# Patient Record
Sex: Male | Born: 1950
Health system: Southern US, Community
[De-identification: ages and names within clinical notes are randomized; demographics above are authoritative.]

## PROBLEM LIST (undated history)

## (undated) DIAGNOSIS — M1990 Unspecified osteoarthritis, unspecified site: Secondary | ICD-10-CM

## (undated) DIAGNOSIS — I10 Essential (primary) hypertension: Secondary | ICD-10-CM

## (undated) DIAGNOSIS — Z8781 Personal history of (healed) traumatic fracture: Secondary | ICD-10-CM

## (undated) DIAGNOSIS — E785 Hyperlipidemia, unspecified: Secondary | ICD-10-CM

## (undated) DIAGNOSIS — G4700 Insomnia, unspecified: Secondary | ICD-10-CM

## (undated) DIAGNOSIS — R519 Headache, unspecified: Secondary | ICD-10-CM

## (undated) DIAGNOSIS — K219 Gastro-esophageal reflux disease without esophagitis: Secondary | ICD-10-CM

## (undated) DIAGNOSIS — M545 Low back pain, unspecified: Secondary | ICD-10-CM

## (undated) DIAGNOSIS — R51 Headache: Secondary | ICD-10-CM

## (undated) DIAGNOSIS — G8928 Other chronic postprocedural pain: Secondary | ICD-10-CM

## (undated) DIAGNOSIS — R748 Abnormal levels of other serum enzymes: Secondary | ICD-10-CM

## (undated) DIAGNOSIS — G8929 Other chronic pain: Secondary | ICD-10-CM

## (undated) HISTORY — DX: Personal history of (healed) traumatic fracture: Z87.81

## (undated) HISTORY — DX: Insomnia, unspecified: G47.00

## (undated) HISTORY — DX: Hyperlipidemia, unspecified: E78.5

## (undated) HISTORY — DX: Other chronic postprocedural pain: G89.28

## (undated) HISTORY — DX: Abnormal levels of other serum enzymes: R74.8

## (undated) HISTORY — DX: Low back pain: M54.5

## (undated) HISTORY — DX: Low back pain, unspecified: M54.50

## (undated) HISTORY — DX: Headache, unspecified: R51.9

## (undated) HISTORY — DX: Essential (primary) hypertension: I10

## (undated) HISTORY — DX: Other chronic pain: G89.29

## (undated) HISTORY — DX: Unspecified osteoarthritis, unspecified site: M19.90

## (undated) HISTORY — DX: Headache: R51

---

## 1977-10-19 DIAGNOSIS — Z8781 Personal history of (healed) traumatic fracture: Secondary | ICD-10-CM | POA: Insufficient documentation

## 1977-10-19 HISTORY — PX: CERVICAL FUSION: SHX112

## 1977-10-19 HISTORY — DX: Personal history of (healed) traumatic fracture: Z87.81

## 1995-10-20 HISTORY — PX: OTHER SURGICAL HISTORY: SHX169

## 2010-11-10 ENCOUNTER — Encounter: Payer: Self-pay | Admitting: Orthopedic Surgery

## 2011-10-20 HISTORY — PX: OTHER SURGICAL HISTORY: SHX169

## 2012-07-22 ENCOUNTER — Other Ambulatory Visit: Payer: Self-pay | Admitting: Orthopedic Surgery

## 2012-07-22 DIAGNOSIS — M25512 Pain in left shoulder: Secondary | ICD-10-CM

## 2012-07-26 ENCOUNTER — Ambulatory Visit
Admission: RE | Admit: 2012-07-26 | Discharge: 2012-07-26 | Disposition: A | Discharge status: Home / Self Care | Payer: PRIVATE HEALTH INSURANCE | Source: Ambulatory Visit | Attending: Orthopedic Surgery | Admitting: Orthopedic Surgery

## 2012-07-26 DIAGNOSIS — M25512 Pain in left shoulder: Secondary | ICD-10-CM

## 2012-09-27 ENCOUNTER — Other Ambulatory Visit: Payer: Self-pay | Admitting: Orthopedic Surgery

## 2012-09-27 DIAGNOSIS — M542 Cervicalgia: Secondary | ICD-10-CM

## 2012-10-01 ENCOUNTER — Ambulatory Visit
Admission: RE | Admit: 2012-10-01 | Discharge: 2012-10-01 | Disposition: A | Discharge status: Home / Self Care | Payer: PRIVATE HEALTH INSURANCE | Source: Ambulatory Visit | Attending: Orthopedic Surgery | Admitting: Orthopedic Surgery

## 2012-10-01 DIAGNOSIS — M542 Cervicalgia: Secondary | ICD-10-CM

## 2012-10-01 MED ORDER — GADOBENATE DIMEGLUMINE 529 MG/ML IV SOLN
15.0000 mL | Freq: Once | INTRAVENOUS | Status: AC | PRN
  Administered 2012-10-01: 15 mL via INTRAVENOUS

## 2013-01-24 ENCOUNTER — Other Ambulatory Visit: Payer: Self-pay | Admitting: Orthopedic Surgery

## 2013-01-24 DIAGNOSIS — M542 Cervicalgia: Secondary | ICD-10-CM

## 2013-02-01 ENCOUNTER — Ambulatory Visit
Admission: RE | Admit: 2013-02-01 | Discharge: 2013-02-01 | Disposition: A | Discharge status: Home / Self Care | Payer: PRIVATE HEALTH INSURANCE | Source: Ambulatory Visit | Attending: Orthopedic Surgery | Admitting: Orthopedic Surgery

## 2013-02-01 VITALS — BP 124/80 | HR 75

## 2013-02-01 DIAGNOSIS — M542 Cervicalgia: Secondary | ICD-10-CM

## 2013-02-01 MED ORDER — IOHEXOL 300 MG/ML  SOLN
1.0000 mL | Freq: Once | INTRAMUSCULAR | Status: AC | PRN
  Administered 2013-02-01: 1 mL via EPIDURAL

## 2013-02-01 MED ORDER — TRIAMCINOLONE ACETONIDE 40 MG/ML IJ SUSP (RADIOLOGY)
60.0000 mg | Freq: Once | INTRAMUSCULAR | Status: AC
  Administered 2013-02-01: 60 mg via EPIDURAL

## 2013-07-26 ENCOUNTER — Emergency Department (HOSPITAL_COMMUNITY): Payer: PRIVATE HEALTH INSURANCE

## 2013-07-26 ENCOUNTER — Emergency Department (HOSPITAL_COMMUNITY)
Admission: EM | Admit: 2013-07-26 | Discharge: 2013-07-26 | Disposition: A | Discharge status: Home / Self Care | Payer: PRIVATE HEALTH INSURANCE | Attending: Emergency Medicine | Admitting: Emergency Medicine

## 2013-07-26 DIAGNOSIS — M546 Pain in thoracic spine: Secondary | ICD-10-CM | POA: Insufficient documentation

## 2013-07-26 DIAGNOSIS — R110 Nausea: Secondary | ICD-10-CM | POA: Insufficient documentation

## 2013-07-26 DIAGNOSIS — I10 Essential (primary) hypertension: Secondary | ICD-10-CM | POA: Insufficient documentation

## 2013-07-26 DIAGNOSIS — R109 Unspecified abdominal pain: Secondary | ICD-10-CM

## 2013-07-26 LAB — CBC WITH DIFFERENTIAL/PLATELET
Basophils Absolute: 0 10*3/uL (ref 0.0–0.1)
Basophils Relative: 0 % (ref 0–1)
Eosinophils Absolute: 0.4 10*3/uL (ref 0.0–0.7)
Eosinophils Relative: 3 % (ref 0–5)
HCT: 42 % (ref 39.0–52.0)
Hemoglobin: 14.5 g/dL (ref 13.0–17.0)
Lymphocytes Relative: 16 % (ref 12–46)
Lymphs Abs: 2.3 10*3/uL (ref 0.7–4.0)
MCH: 32.2 pg (ref 26.0–34.0)
MCHC: 34.5 g/dL (ref 30.0–36.0)
MCV: 93.3 fL (ref 78.0–100.0)
Monocytes Absolute: 1.3 10*3/uL — ABNORMAL HIGH (ref 0.1–1.0)
Monocytes Relative: 9 % (ref 3–12)
Neutro Abs: 10.3 10*3/uL — ABNORMAL HIGH (ref 1.7–7.7)
Neutrophils Relative %: 72 % (ref 43–77)
Platelets: 173 10*3/uL (ref 150–400)
RBC: 4.5 MIL/uL (ref 4.22–5.81)
RDW: 13.1 % (ref 11.5–15.5)
WBC: 14.3 10*3/uL — ABNORMAL HIGH (ref 4.0–10.5)

## 2013-07-26 LAB — URINALYSIS, ROUTINE W REFLEX MICROSCOPIC
Bilirubin Urine: NEGATIVE
Glucose, UA: NEGATIVE mg/dL
Hgb urine dipstick: NEGATIVE
Ketones, ur: 15 mg/dL — AB
Leukocytes, UA: NEGATIVE
Nitrite: NEGATIVE
Protein, ur: NEGATIVE mg/dL
Specific Gravity, Urine: 1.028 (ref 1.005–1.030)
Urobilinogen, UA: 1 mg/dL (ref 0.0–1.0)
pH: 6 (ref 5.0–8.0)

## 2013-07-26 LAB — ABO/RH: ABO/RH(D): O POS

## 2013-07-26 LAB — POCT I-STAT, CHEM 8
BUN: 6 mg/dL (ref 6–23)
Calcium, Ion: 1.17 mmol/L (ref 1.12–1.23)
Chloride: 103 mEq/L (ref 96–112)
Creatinine, Ser: 0.9 mg/dL (ref 0.50–1.35)
Glucose, Bld: 137 mg/dL — ABNORMAL HIGH (ref 70–99)
HCT: 46 % (ref 39.0–52.0)
Hemoglobin: 15.6 g/dL (ref 13.0–17.0)
Potassium: 3.8 mEq/L (ref 3.5–5.1)
Sodium: 142 mEq/L (ref 135–145)
TCO2: 25 mmol/L (ref 0–100)

## 2013-07-26 LAB — HEPATIC FUNCTION PANEL
ALT: 105 U/L — ABNORMAL HIGH (ref 0–53)
AST: 183 U/L — ABNORMAL HIGH (ref 0–37)
Albumin: 4.5 g/dL (ref 3.5–5.2)
Alkaline Phosphatase: 75 U/L (ref 39–117)
Bilirubin, Direct: 0.4 mg/dL — ABNORMAL HIGH (ref 0.0–0.3)
Indirect Bilirubin: 0.4 mg/dL (ref 0.3–0.9)
Total Bilirubin: 0.8 mg/dL (ref 0.3–1.2)
Total Protein: 7.6 g/dL (ref 6.0–8.3)

## 2013-07-26 LAB — TYPE AND SCREEN
ABO/RH(D): O POS
Antibody Screen: NEGATIVE

## 2013-07-26 LAB — POCT I-STAT TROPONIN I: Troponin i, poc: 0 ng/mL (ref 0.00–0.08)

## 2013-07-26 LAB — LIPASE, BLOOD: Lipase: 59 U/L (ref 11–59)

## 2013-07-26 MED ORDER — IOHEXOL 300 MG/ML  SOLN: 25.0000 mL | Freq: Once | INTRAMUSCULAR | Status: DC | PRN

## 2013-07-26 MED ORDER — OXYCODONE-ACETAMINOPHEN 5-325 MG PO TABS: 2.0000 | ORAL_TABLET | ORAL | Status: DC | PRN

## 2013-07-26 MED ORDER — FENTANYL CITRATE 0.05 MG/ML IJ SOLN
50.0000 ug | Freq: Once | INTRAMUSCULAR | Status: AC
  Administered 2013-07-26: 50 ug via INTRAVENOUS

## 2013-07-26 MED ORDER — FENTANYL CITRATE 0.05 MG/ML IJ SOLN
INTRAMUSCULAR | Status: AC
  Filled 2013-07-26: qty 2

## 2013-07-26 MED ORDER — IOHEXOL 300 MG/ML  SOLN
80.0000 mL | Freq: Once | INTRAMUSCULAR | Status: AC | PRN
  Administered 2013-07-26: 80 mL via INTRAVENOUS

## 2013-07-26 NOTE — ED Notes (Signed)
Pt made aware of need of urine specimen; pt handed urinal to attempt to provide a sample; family at bedside

## 2013-07-26 NOTE — ED Provider Notes (Signed)
CSN: [id number]     Arrival date & time 07/26/13  0509 History   First MD Initiated Contact with Patient 07/26/13 [phone]     Chief Complaint  Patient presents with  . Abdominal Pain    Patient is a 62 y.o. male presenting with abdominal pain. The history is provided by the patient and the EMS personnel.  Abdominal Pain Pain location:  RUQ and LUQ Pain radiation: upper back. Pain severity:  Severe Onset quality:  Sudden Duration:  3 hours Timing:  Constant Progression:  Worsening Chronicity:  New Context: awakening from sleep   Relieved by:  Nothing Worsened by:  Palpation and movement Associated symptoms: nausea   Associated symptoms: no chest pain, no fever and no vomiting    Pt reports he woke up with severe abdominal pain He reports it first started in lower abdomen and now seems to move into upper abdomen He denies cp/sob He has never had this pain before He denies h/o abdominal surgeries    PMH - HTN Soc hx - lives at home  History  Substance Use Topics  . Smoking status: Not on file  . Smokeless tobacco: Not on file  . Alcohol Use: Not on file    Review of Systems  Constitutional: Negative for fever.  Cardiovascular: Negative for chest pain.  Gastrointestinal: Positive for nausea and abdominal pain. Negative for vomiting.  All other systems reviewed and are negative.    Allergies  Review of patient's allergies indicates no known allergies.  Home Medications  No current outpatient prescriptions on file. BP 134/78  Pulse 83  Resp 12  SpO2 99%  Physical Exam CONSTITUTIONAL: uncomfortable appearing HEAD: Normocephalic/atraumatic EYES: EOMI/PERRL ENMT: Mucous membranes moist NECK: supple no meningeal signs SPINE:entire spine nontender CV: S1/S2 noted, no murmurs/rubs/gallops noted LUNGS: Lungs are clear to auscultation bilaterally, no apparent distress ABDOMEN: soft, significant tenderness to RUQ/epigastric/LUQ region.  No rebound.  His lower abdomen  is soft to palpation GU:no cva tenderness, no hernia noted.  No scrotal tenderness noted, chaperone present NEURO: Pt is awake/alert, moves all extremitiesx4 EXTREMITIES: pulses normal, full ROM SKIN: pale PSYCH:anxious  ED Course  Procedures Labs Review Labs Reviewed  CBC WITH DIFFERENTIAL  HEPATIC FUNCTION PANEL  LIPASE, BLOOD  TYPE AND SCREEN   5:35 AM Pt seen on arrival.  He is ill appearing, but his vitals are stable, no hypotension His pain is now localized in upper quadrants Bedside Ultrasound attempted but limited images and pt did not tolerate exam Will follow closely and will need CT imaging of abd/pelvis 8:12 AM Pt reports all of his pain is resolved He is in no distress His abdomen is soft and no focal RUQ tenderness ?gallbladder sludge on CT.  It is possible he had episode of biliary colic but now has no signs of surgical abdomen Pt is comfortable going home and have his PCP re-exam and order possible abdominal US His only lab test is u/a.  At signout, plan is to f/u on urinalysis.  He can then be discharged home  MDM  No diagnosis found. Nursing notes including past medical history and social history reviewed and considered in documentation Labs/vital reviewed and considered       Date: 07/26/2013  Rate: 77  Rhythm: normal sinus rhythm  QRS Axis: normal  Intervals: normal  ST/T Wave abnormalities: nonspecific ST changes  Conduction Disutrbances:none  Narrative Interpretation:   Old EKG Reviewed: none available at time of interpretation    Joya Gaskins, MD 07/26/13  0815 

## 2013-07-26 NOTE — ED Notes (Signed)
Ct made aware of 0.9 creatinine level

## 2013-07-26 NOTE — ED Notes (Signed)
Pt wife given a cup of coffee (spoon, sugar and creamer on the side)

## 2013-07-26 NOTE — ED Notes (Signed)
Pt d/c'd from both IV's, monitor, continuous pulse oximetry and blood pressure cuff; pt being discharged to go home; wife at bedside

## 2013-07-26 NOTE — ED Notes (Signed)
Pt taken to CT.

## 2013-07-26 NOTE — ED Notes (Signed)
Per EMS, PT awoke with severe abd pain in Upper ABD radiating to back and left shoulder.

## 2016-05-20 ENCOUNTER — Ambulatory Visit (INDEPENDENT_AMBULATORY_CARE_PROVIDER_SITE_OTHER): Payer: PRIVATE HEALTH INSURANCE | Admitting: Family Medicine

## 2016-05-20 ENCOUNTER — Telehealth: Payer: Self-pay | Admitting: Family Medicine

## 2016-05-20 ENCOUNTER — Encounter: Payer: Self-pay | Admitting: Family Medicine

## 2016-05-20 VITALS — BP 133/87 | HR 95 | Temp 98.3°F | Resp 20 | Ht 69.0 in | Wt 205.2 lb

## 2016-05-20 DIAGNOSIS — Z7189 Other specified counseling: Secondary | ICD-10-CM | POA: Diagnosis not present

## 2016-05-20 DIAGNOSIS — M1990 Unspecified osteoarthritis, unspecified site: Secondary | ICD-10-CM | POA: Insufficient documentation

## 2016-05-20 DIAGNOSIS — I10 Essential (primary) hypertension: Secondary | ICD-10-CM | POA: Insufficient documentation

## 2016-05-20 DIAGNOSIS — G4700 Insomnia, unspecified: Secondary | ICD-10-CM | POA: Insufficient documentation

## 2016-05-20 DIAGNOSIS — Z8781 Personal history of (healed) traumatic fracture: Secondary | ICD-10-CM

## 2016-05-20 DIAGNOSIS — G8929 Other chronic pain: Secondary | ICD-10-CM | POA: Insufficient documentation

## 2016-05-20 DIAGNOSIS — E785 Hyperlipidemia, unspecified: Secondary | ICD-10-CM | POA: Insufficient documentation

## 2016-05-20 DIAGNOSIS — G44329 Chronic post-traumatic headache, not intractable: Secondary | ICD-10-CM

## 2016-05-20 DIAGNOSIS — R519 Headache, unspecified: Secondary | ICD-10-CM | POA: Insufficient documentation

## 2016-05-20 DIAGNOSIS — G8928 Other chronic postprocedural pain: Secondary | ICD-10-CM

## 2016-05-20 DIAGNOSIS — F1190 Opioid use, unspecified, uncomplicated: Secondary | ICD-10-CM | POA: Insufficient documentation

## 2016-05-20 DIAGNOSIS — R51 Headache: Secondary | ICD-10-CM

## 2016-05-20 DIAGNOSIS — M545 Low back pain, unspecified: Secondary | ICD-10-CM | POA: Insufficient documentation

## 2016-05-20 DIAGNOSIS — Z79899 Other long term (current) drug therapy: Secondary | ICD-10-CM | POA: Insufficient documentation

## 2016-05-20 DIAGNOSIS — E669 Obesity, unspecified: Secondary | ICD-10-CM

## 2016-05-20 DIAGNOSIS — Z7689 Persons encountering health services in other specified circumstances: Secondary | ICD-10-CM

## 2016-05-20 LAB — COMPREHENSIVE METABOLIC PANEL
ALT: 29 U/L (ref 0–53)
AST: 33 U/L (ref 0–37)
Albumin: 4.6 g/dL (ref 3.5–5.2)
Alkaline Phosphatase: 49 U/L (ref 39–117)
BUN: 16 mg/dL (ref 6–23)
CO2: 28 mEq/L (ref 19–32)
Calcium: 10 mg/dL (ref 8.4–10.5)
Chloride: 106 mEq/L (ref 96–112)
Creatinine, Ser: 0.95 mg/dL (ref 0.40–1.50)
GFR: 85.21 mL/min (ref >60.00)
Glucose, Bld: 112 mg/dL — ABNORMAL HIGH (ref 70–99)
Potassium: 5.3 mEq/L — ABNORMAL HIGH (ref 3.5–5.1)
Sodium: 141 mEq/L (ref 135–145)
Total Bilirubin: 0.9 mg/dL (ref 0.2–1.2)
Total Protein: 7.2 g/dL (ref 6.0–8.3)
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
## 2016-05-20 NOTE — Telephone Encounter (Addendum)
Please call pt: - His kidney appears normal.  - His potassium is mildly elevated. If he is taking a potassium supplement please have him stop, and avoid high potassium foods for now. Will retest when we see him again.  - I did receive his last few OV notes. Please have him schedule an appt end next week for medication management for his chronic pain.   Results for orders placed or performed in visit on 05/20/16 (from the past 24 hour(s))  Comp Met (CMET)     Status: Abnormal   Collection Time: 05/20/16 11:03 AM  Result Value Ref Range   Sodium 141 135 - 145 mEq/L   Potassium 5.3 (H) 3.5 - 5.1 mEq/L   Chloride 106 96 - 112 mEq/L   CO2 28 19 - 32 mEq/L   Glucose, Bld 112 (H) 70 - 99 mg/dL   BUN 16 6 - 23 mg/dL   Creatinine, Ser 0.95 0.40 - 1.50 mg/dL   Total Bilirubin 0.9 0.2 - 1.2 mg/dL   Alkaline Phosphatase 49 39 - 117 U/L   AST 33 0 - 37 U/L   ALT 29 0 - 53 U/L   Total Protein 7.2 6.0 - 8.3 g/dL   Albumin 4.6 3.5 - 5.2 g/dL   Calcium 10.0 8.4 - 10.5 mg/dL   GFR 85.21 >60.00 mL/min

## 2016-05-20 NOTE — Patient Instructions (Signed)
It was a pleasure meeting you .  I will get records faxed ASAP.  Likely be able to get you in next week to discuss medications for your neck.

## 2016-05-21 NOTE — Telephone Encounter (Signed)
Left message for patient to return call to review results. 

## 2016-05-22 NOTE — Telephone Encounter (Signed)
Spoke with patient reviewed lab results and scheduled patient appt.

## 2016-05-26 ENCOUNTER — Ambulatory Visit (INDEPENDENT_AMBULATORY_CARE_PROVIDER_SITE_OTHER): Payer: PRIVATE HEALTH INSURANCE | Admitting: Family Medicine

## 2016-05-26 ENCOUNTER — Encounter: Payer: Self-pay | Admitting: Family Medicine

## 2016-05-26 VITALS — BP 130/83 | HR 81 | Temp 98.4°F | Resp 20 | Ht 69.0 in | Wt 207.5 lb

## 2016-05-26 DIAGNOSIS — M545 Low back pain, unspecified: Secondary | ICD-10-CM

## 2016-05-26 DIAGNOSIS — Z8781 Personal history of (healed) traumatic fracture: Secondary | ICD-10-CM | POA: Diagnosis not present

## 2016-05-26 DIAGNOSIS — M1990 Unspecified osteoarthritis, unspecified site: Secondary | ICD-10-CM | POA: Diagnosis not present

## 2016-05-26 DIAGNOSIS — G44329 Chronic post-traumatic headache, not intractable: Secondary | ICD-10-CM

## 2016-05-26 DIAGNOSIS — F1190 Opioid use, unspecified, uncomplicated: Secondary | ICD-10-CM | POA: Diagnosis not present

## 2016-05-26 DIAGNOSIS — Z7189 Other specified counseling: Secondary | ICD-10-CM

## 2016-05-26 DIAGNOSIS — G8928 Other chronic postprocedural pain: Secondary | ICD-10-CM

## 2016-05-26 DIAGNOSIS — G8929 Other chronic pain: Secondary | ICD-10-CM | POA: Insufficient documentation

## 2016-05-26 MED ORDER — HYDROCODONE-ACETAMINOPHEN 10-325 MG PO TABS: 1.0000 | ORAL_TABLET | Freq: Three times a day (TID) | ORAL | 0 refills | Status: DC | PRN

## 2016-05-26 MED ORDER — NORTRIPTYLINE HCL 10 MG PO CAPS: 10.0000 mg | ORAL_CAPSULE | Freq: Every day | ORAL | 1 refills | Status: DC

## 2016-05-26 NOTE — Patient Instructions (Signed)
Chronic pain management follow up is every 2 months.  - I will need to see you in 4 weeks to see how you are doing with the nortriptyline. If you feel you could use a higher dose of Nortriptyline, you can try to increase to 20 mg after 1 week of the 10 mg.
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
## 2016-06-16 ENCOUNTER — Telehealth: Payer: Self-pay | Admitting: *Deleted

## 2016-06-16 MED ORDER — NORTRIPTYLINE HCL 10 MG PO CAPS: 20.0000 mg | ORAL_CAPSULE | Freq: Every day | ORAL | 0 refills | Status: DC

## 2016-06-16 NOTE — Telephone Encounter (Signed)
Patient called states he has increased his nortriptyline to 20 mg as instructed but is running out of medication soon and needs script sent to pharmacy with correct dosing. Please advise.

## 2016-06-16 NOTE — Telephone Encounter (Signed)
Refilled medication today. Pill does not come in 20 mg dose, therefore take 2-10 mg pills.  Keep f/u in September.

## 2016-06-17 NOTE — Telephone Encounter (Signed)
Left message with information on patient voice mail 

## 2016-06-23 ENCOUNTER — Encounter: Payer: Self-pay | Admitting: Family Medicine

## 2016-06-23 ENCOUNTER — Ambulatory Visit (INDEPENDENT_AMBULATORY_CARE_PROVIDER_SITE_OTHER): Payer: PRIVATE HEALTH INSURANCE | Admitting: Family Medicine

## 2016-06-23 VITALS — BP 141/99 | HR 74 | Temp 98.3°F | Resp 20 | Ht 69.0 in | Wt 209.2 lb

## 2016-06-23 DIAGNOSIS — Z79899 Other long term (current) drug therapy: Secondary | ICD-10-CM

## 2016-06-23 DIAGNOSIS — G4700 Insomnia, unspecified: Secondary | ICD-10-CM | POA: Diagnosis not present

## 2016-06-23 DIAGNOSIS — G8929 Other chronic pain: Secondary | ICD-10-CM

## 2016-06-23 DIAGNOSIS — G44329 Chronic post-traumatic headache, not intractable: Secondary | ICD-10-CM

## 2016-06-23 DIAGNOSIS — Z7189 Other specified counseling: Secondary | ICD-10-CM | POA: Diagnosis not present

## 2016-06-23 DIAGNOSIS — Z8781 Personal history of (healed) traumatic fracture: Secondary | ICD-10-CM | POA: Diagnosis not present

## 2016-06-23 MED ORDER — NORTRIPTYLINE HCL 25 MG PO CAPS: 25.0000 mg | ORAL_CAPSULE | Freq: Every day | ORAL | 1 refills | Status: DC

## 2016-06-23 MED ORDER — GABAPENTIN 100 MG PO CAPS: ORAL_CAPSULE | ORAL | 0 refills | Status: DC
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
## 2016-06-23 NOTE — Patient Instructions (Signed)
Nortriptyline will increase to 25 mg on next script.  Gabapentin start 100 mg every 8 hours (300 mg at night), 1 week increase to 200 mg every 8 hours, (600 mg at night), if not sedating and feel you could use more coverage increase to 300 mg every 8 hours (900 mg at night).

## 2016-07-22 ENCOUNTER — Ambulatory Visit (INDEPENDENT_AMBULATORY_CARE_PROVIDER_SITE_OTHER): Payer: PRIVATE HEALTH INSURANCE | Admitting: Family Medicine

## 2016-07-22 ENCOUNTER — Encounter: Payer: Self-pay | Admitting: Family Medicine

## 2016-07-22 VITALS — BP 129/81 | HR 92 | Temp 98.9°F | Resp 20 | Ht 69.0 in | Wt 213.0 lb

## 2016-07-22 DIAGNOSIS — G8929 Other chronic pain: Secondary | ICD-10-CM

## 2016-07-22 DIAGNOSIS — G4700 Insomnia, unspecified: Secondary | ICD-10-CM

## 2016-07-22 DIAGNOSIS — M545 Low back pain, unspecified: Secondary | ICD-10-CM

## 2016-07-22 DIAGNOSIS — F1190 Opioid use, unspecified, uncomplicated: Secondary | ICD-10-CM | POA: Diagnosis not present

## 2016-07-22 DIAGNOSIS — M5010 Cervical disc disorder with radiculopathy, unspecified cervical region: Secondary | ICD-10-CM | POA: Insufficient documentation

## 2016-07-22 DIAGNOSIS — G8928 Other chronic postprocedural pain: Secondary | ICD-10-CM | POA: Diagnosis not present

## 2016-07-22 DIAGNOSIS — Z8781 Personal history of (healed) traumatic fracture: Secondary | ICD-10-CM

## 2016-07-22 DIAGNOSIS — M1990 Unspecified osteoarthritis, unspecified site: Secondary | ICD-10-CM

## 2016-07-22 DIAGNOSIS — G44329 Chronic post-traumatic headache, not intractable: Secondary | ICD-10-CM

## 2016-07-22 MED ORDER — GABAPENTIN 300 MG PO CAPS: ORAL_CAPSULE | ORAL | 5 refills | Status: DC

## 2016-07-22 MED ORDER — HYDROCODONE-ACETAMINOPHEN 10-325 MG PO TABS: 1.0000 | ORAL_TABLET | Freq: Three times a day (TID) | ORAL | 0 refills | Status: DC | PRN

## 2016-07-22 MED ORDER — CYCLOBENZAPRINE HCL 10 MG PO TABS: 10.0000 mg | ORAL_TABLET | Freq: Two times a day (BID) | ORAL | 0 refills | Status: DC | PRN

## 2016-07-22 MED ORDER — METHYLPREDNISOLONE ACETATE 80 MG/ML IJ SUSP
80.0000 mg | Freq: Once | INTRAMUSCULAR | Status: AC
Start: 2016-07-22 — End: 2016-07-22
  Administered 2016-07-22: 80 mg via INTRAMUSCULAR

## 2016-07-22 MED ORDER — PREDNISONE 20 MG PO TABS: 20.0000 mg | ORAL_TABLET | Freq: Every day | ORAL | 0 refills | Status: DC
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
## 2016-07-22 NOTE — Patient Instructions (Signed)
I have called in flexeril, muscle relaxer, take at least before bed. If it does make you too drowsy you can take during the day once as well. Caution with driving.  I have called in the gabapentin, the new pills are a higher dose (300 mg ea), therefore you take 1 pill in morning and afternoon, 3 pills at night.   I have also called in prednisone taper to take as directed over the next ten days.   If not improved or worsening within next week then call in immediately, I will want to order imaging ans see you after to review results and consider referral. Hopefully this is just a flare and will calm down without needing further evaluation.

## 2016-07-29 ENCOUNTER — Telehealth: Payer: Self-pay | Admitting: *Deleted

## 2016-07-29 ENCOUNTER — Telehealth: Payer: Self-pay | Admitting: Family Medicine

## 2016-07-29 DIAGNOSIS — Z8781 Personal history of (healed) traumatic fracture: Secondary | ICD-10-CM

## 2016-07-29 DIAGNOSIS — M5010 Cervical disc disorder with radiculopathy, unspecified cervical region: Secondary | ICD-10-CM

## 2016-07-29 DIAGNOSIS — G8928 Other chronic postprocedural pain: Secondary | ICD-10-CM

## 2016-07-29 DIAGNOSIS — M542 Cervicalgia: Secondary | ICD-10-CM

## 2016-07-29 DIAGNOSIS — G44329 Chronic post-traumatic headache, not intractable: Secondary | ICD-10-CM

## 2016-07-29 NOTE — Telephone Encounter (Signed)
Order for MRI placed, pt to follow 2 days after completed to review all results and review plan.

## 2016-07-29 NOTE — Telephone Encounter (Signed)
Patient states he is not doing any better. Patient is requesting MRI. Please advise

## 2016-07-30 NOTE — Telephone Encounter (Signed)
Left message with information on patient voice mail 

## 2016-08-10 ENCOUNTER — Ambulatory Visit
Admission: RE | Admit: 2016-08-10 | Discharge: 2016-08-10 | Disposition: A | Discharge status: Home / Self Care | Payer: PRIVATE HEALTH INSURANCE | Source: Ambulatory Visit | Attending: Family Medicine | Admitting: Family Medicine

## 2016-08-10 ENCOUNTER — Other Ambulatory Visit: Payer: Self-pay | Admitting: Family Medicine

## 2016-08-10 DIAGNOSIS — G44329 Chronic post-traumatic headache, not intractable: Secondary | ICD-10-CM

## 2016-08-10 DIAGNOSIS — M5010 Cervical disc disorder with radiculopathy, unspecified cervical region: Secondary | ICD-10-CM

## 2016-08-10 DIAGNOSIS — M542 Cervicalgia: Secondary | ICD-10-CM

## 2016-08-10 DIAGNOSIS — Z8781 Personal history of (healed) traumatic fracture: Secondary | ICD-10-CM

## 2016-08-10 DIAGNOSIS — G8928 Other chronic postprocedural pain: Secondary | ICD-10-CM

## 2016-08-11 ENCOUNTER — Other Ambulatory Visit: Payer: Self-pay | Admitting: Family Medicine

## 2016-08-11 ENCOUNTER — Telehealth: Payer: Self-pay | Admitting: Family Medicine

## 2016-08-11 DIAGNOSIS — M5010 Cervical disc disorder with radiculopathy, unspecified cervical region: Secondary | ICD-10-CM

## 2016-08-11 MED ORDER — CYCLOBENZAPRINE HCL 10 MG PO TABS: 10.0000 mg | ORAL_TABLET | Freq: Two times a day (BID) | ORAL | 0 refills | Status: DC | PRN

## 2016-08-11 NOTE — Telephone Encounter (Signed)
Patient called he states he needs refill on flexeril and he states he is setting up appt with you to discuss neuro referral.

## 2016-08-11 NOTE — Telephone Encounter (Signed)
Left detailed message with resuts and instructions on patient voice mail per DPR 

## 2016-08-11 NOTE — Telephone Encounter (Signed)
Please call pt: - I do not see he has a followup to discuss his acute on chronic neck pain. If he is still having increased pain, I will want to refer him to new neurologist, if he is improved then we can wait.  - His MRI is stable from prior MRI in 2013.

## 2016-09-23 ENCOUNTER — Telehealth: Payer: Self-pay | Admitting: *Deleted

## 2016-09-23 NOTE — Telephone Encounter (Signed)
Patient called left message stating he needed refills on his hydrocodone and flexeril. Spoke with patient he states Dr Claiborne BillingsKuneff agreed to fill this prior to his next app which is a CPE on 09/30/16. Spoke with Dr Claiborne BillingsKuneff patient must make an appt every 2 months for pain management this is not covered at his CPE appt. Spoke with patient scheduled an appt for pain management.

## 2016-09-25 ENCOUNTER — Ambulatory Visit (INDEPENDENT_AMBULATORY_CARE_PROVIDER_SITE_OTHER): Payer: PRIVATE HEALTH INSURANCE | Admitting: Family Medicine

## 2016-09-25 ENCOUNTER — Encounter: Payer: Self-pay | Admitting: Family Medicine

## 2016-09-25 VITALS — BP 132/84 | HR 89 | Temp 98.4°F | Resp 20 | Ht 69.0 in | Wt 213.5 lb

## 2016-09-25 DIAGNOSIS — M545 Low back pain, unspecified: Secondary | ICD-10-CM

## 2016-09-25 DIAGNOSIS — M542 Cervicalgia: Secondary | ICD-10-CM | POA: Diagnosis not present

## 2016-09-25 DIAGNOSIS — G4700 Insomnia, unspecified: Secondary | ICD-10-CM

## 2016-09-25 DIAGNOSIS — F1190 Opioid use, unspecified, uncomplicated: Secondary | ICD-10-CM

## 2016-09-25 DIAGNOSIS — G8928 Other chronic postprocedural pain: Secondary | ICD-10-CM | POA: Diagnosis not present

## 2016-09-25 DIAGNOSIS — Z8781 Personal history of (healed) traumatic fracture: Secondary | ICD-10-CM

## 2016-09-25 DIAGNOSIS — M5010 Cervical disc disorder with radiculopathy, unspecified cervical region: Secondary | ICD-10-CM

## 2016-09-25 DIAGNOSIS — G8929 Other chronic pain: Secondary | ICD-10-CM

## 2016-09-25 DIAGNOSIS — M1990 Unspecified osteoarthritis, unspecified site: Secondary | ICD-10-CM

## 2016-09-25 DIAGNOSIS — Z23 Encounter for immunization: Secondary | ICD-10-CM

## 2016-09-25 MED ORDER — CYCLOBENZAPRINE HCL 10 MG PO TABS: 10.0000 mg | ORAL_TABLET | Freq: Three times a day (TID) | ORAL | 3 refills | Status: DC | PRN

## 2016-09-25 MED ORDER — HYDROCODONE-ACETAMINOPHEN 10-325 MG PO TABS: 1.0000 | ORAL_TABLET | Freq: Three times a day (TID) | ORAL | 0 refills | Status: DC | PRN

## 2016-09-25 MED ORDER — NORTRIPTYLINE HCL 50 MG PO CAPS: 50.0000 mg | ORAL_CAPSULE | Freq: Every day | ORAL | 3 refills | Status: DC
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
## 2016-09-25 NOTE — Patient Instructions (Signed)
Increase the nortriptyline to 50 mg before bed . Refills on gabapentin and pain medication provided today.  All medications were refilled with 90d each, and refills (with the exception of pain med)   Pain followup every 3 months.

## 2016-09-28 NOTE — Addendum Note (Signed)
Addended by: Thomasena EdisWILLIAMS, SUZANNE N on: 09/28/2016 08:00 AM   Modules accepted: Orders

## 2016-09-30 ENCOUNTER — Ambulatory Visit (INDEPENDENT_AMBULATORY_CARE_PROVIDER_SITE_OTHER): Payer: PRIVATE HEALTH INSURANCE | Admitting: Family Medicine

## 2016-09-30 ENCOUNTER — Encounter: Payer: Self-pay | Admitting: Family Medicine

## 2016-09-30 VITALS — BP 133/85 | HR 83 | Temp 98.9°F | Resp 20 | Ht 69.0 in | Wt 214.0 lb

## 2016-09-30 DIAGNOSIS — Z0000 Encounter for general adult medical examination without abnormal findings: Secondary | ICD-10-CM

## 2016-09-30 DIAGNOSIS — Z125 Encounter for screening for malignant neoplasm of prostate: Secondary | ICD-10-CM

## 2016-09-30 DIAGNOSIS — E785 Hyperlipidemia, unspecified: Secondary | ICD-10-CM | POA: Diagnosis not present

## 2016-09-30 DIAGNOSIS — Z13220 Encounter for screening for lipoid disorders: Secondary | ICD-10-CM

## 2016-09-30 DIAGNOSIS — F1190 Opioid use, unspecified, uncomplicated: Secondary | ICD-10-CM

## 2016-09-30 DIAGNOSIS — E669 Obesity, unspecified: Secondary | ICD-10-CM

## 2016-09-30 DIAGNOSIS — Z130 Encounter for screening for diseases of the blood and blood-forming organs and certain disorders involving the immune mechanism: Secondary | ICD-10-CM

## 2016-09-30 DIAGNOSIS — Z131 Encounter for screening for diabetes mellitus: Secondary | ICD-10-CM

## 2016-09-30 DIAGNOSIS — Z114 Encounter for screening for human immunodeficiency virus [HIV]: Secondary | ICD-10-CM

## 2016-09-30 DIAGNOSIS — I10 Essential (primary) hypertension: Secondary | ICD-10-CM | POA: Diagnosis not present

## 2016-09-30 DIAGNOSIS — Z79899 Other long term (current) drug therapy: Secondary | ICD-10-CM

## 2016-09-30 DIAGNOSIS — Z113 Encounter for screening for infections with a predominantly sexual mode of transmission: Secondary | ICD-10-CM

## 2016-09-30 DIAGNOSIS — Z1159 Encounter for screening for other viral diseases: Secondary | ICD-10-CM

## 2016-09-30 LAB — CBC WITH DIFFERENTIAL/PLATELET
Basophils Absolute: 0 10*3/uL (ref 0.0–0.1)
Basophils Relative: 0.6 % (ref 0.0–3.0)
Eosinophils Absolute: 0.4 10*3/uL (ref 0.0–0.7)
Eosinophils Relative: 6.8 % — ABNORMAL HIGH (ref 0.0–5.0)
HCT: 44.2 % (ref 39.0–52.0)
Hemoglobin: 15 g/dL (ref 13.0–17.0)
Lymphocytes Relative: 29.6 % (ref 12.0–46.0)
Lymphs Abs: 1.8 10*3/uL (ref 0.7–4.0)
MCHC: 34 g/dL (ref 30.0–36.0)
MCV: 92.3 fl (ref 78.0–100.0)
Monocytes Absolute: 0.7 10*3/uL (ref 0.1–1.0)
Monocytes Relative: 11.2 % (ref 3.0–12.0)
Neutro Abs: 3.2 10*3/uL (ref 1.4–7.7)
Neutrophils Relative %: 51.8 % (ref 43.0–77.0)
Platelets: 179 10*3/uL (ref 150.0–400.0)
RBC: 4.79 Mil/uL (ref 4.22–5.81)
RDW: 13.8 % (ref 11.5–15.5)
WBC: 6.2 10*3/uL (ref 4.0–10.5)

## 2016-09-30 LAB — COMPREHENSIVE METABOLIC PANEL
ALT: 25 U/L (ref 0–53)
AST: 23 U/L (ref 0–37)
Albumin: 4.8 g/dL (ref 3.5–5.2)
Alkaline Phosphatase: 54 U/L (ref 39–117)
BUN: 11 mg/dL (ref 6–23)
CO2: 29 mEq/L (ref 19–32)
Calcium: 9.4 mg/dL (ref 8.4–10.5)
Chloride: 105 mEq/L (ref 96–112)
Creatinine, Ser: 0.82 mg/dL (ref 0.40–1.50)
GFR: 100.87 mL/min (ref >60.00)
Glucose, Bld: 105 mg/dL — ABNORMAL HIGH (ref 70–99)
Potassium: 4.6 mEq/L (ref 3.5–5.1)
Sodium: 143 mEq/L (ref 135–145)
Total Bilirubin: 0.7 mg/dL (ref 0.2–1.2)
Total Protein: 7.1 g/dL (ref 6.0–8.3)

## 2016-09-30 LAB — LIPID PANEL
Cholesterol: 141 mg/dL (ref 0–200)
HDL: 40.6 mg/dL (ref >39.00)
LDL Cholesterol: 63 mg/dL (ref 0–99)
NonHDL: 100.63
Total CHOL/HDL Ratio: 3
Triglycerides: 187 mg/dL — ABNORMAL HIGH (ref 0.0–149.0)
VLDL: 37.4 mg/dL (ref 0.0–40.0)

## 2016-09-30 LAB — PSA: PSA: 0.37 ng/mL (ref 0.10–4.00)

## 2016-09-30 LAB — HEMOGLOBIN A1C: Hgb A1c MFr Bld: 5.6 % (ref 4.6–6.5)

## 2016-09-30 MED ORDER — AMLODIPINE BESY-BENAZEPRIL HCL 5-10 MG PO CAPS: 1.0000 | ORAL_CAPSULE | Freq: Every day | ORAL | 1 refills | Status: DC

## 2016-09-30 MED ORDER — LOVASTATIN 40 MG PO TABS
80.0000 mg | ORAL_TABLET | Freq: Every day | ORAL | 3 refills | Status: DC
Start: 2016-09-30 — End: 2017-12-06

## 2016-09-30 NOTE — Patient Instructions (Signed)
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
## 2016-10-01 LAB — HEPATITIS C ANTIBODY: HCV Ab: NEGATIVE

## 2016-10-01 LAB — HIV ANTIBODY (ROUTINE TESTING W REFLEX): HIV 1&2 Ab, 4th Generation: NONREACTIVE

## 2016-10-14 ENCOUNTER — Telehealth: Payer: PRIVATE HEALTH INSURANCE | Admitting: Family

## 2016-10-14 DIAGNOSIS — J209 Acute bronchitis, unspecified: Secondary | ICD-10-CM

## 2016-10-14 MED ORDER — AZITHROMYCIN 250 MG PO TABS: ORAL_TABLET | ORAL | 0 refills | Status: DC

## 2016-10-14 MED ORDER — BENZONATATE 100 MG PO CAPS: 100.0000 mg | ORAL_CAPSULE | Freq: Three times a day (TID) | ORAL | 0 refills | Status: DC | PRN

## 2016-10-14 NOTE — Progress Notes (Signed)

## 2016-12-21 ENCOUNTER — Encounter: Payer: Self-pay | Admitting: Family Medicine

## 2016-12-21 ENCOUNTER — Other Ambulatory Visit: Payer: Self-pay | Admitting: *Deleted

## 2016-12-21 ENCOUNTER — Ambulatory Visit (INDEPENDENT_AMBULATORY_CARE_PROVIDER_SITE_OTHER): Payer: PRIVATE HEALTH INSURANCE | Admitting: Family Medicine

## 2016-12-21 VITALS — BP 138/85 | HR 89 | Temp 98.2°F | Resp 20 | Ht 69.0 in | Wt 211.0 lb

## 2016-12-21 DIAGNOSIS — G8928 Other chronic postprocedural pain: Secondary | ICD-10-CM

## 2016-12-21 DIAGNOSIS — G4700 Insomnia, unspecified: Secondary | ICD-10-CM | POA: Diagnosis not present

## 2016-12-21 DIAGNOSIS — M5010 Cervical disc disorder with radiculopathy, unspecified cervical region: Secondary | ICD-10-CM

## 2016-12-21 DIAGNOSIS — M1990 Unspecified osteoarthritis, unspecified site: Secondary | ICD-10-CM

## 2016-12-21 DIAGNOSIS — M545 Low back pain, unspecified: Secondary | ICD-10-CM

## 2016-12-21 DIAGNOSIS — G8929 Other chronic pain: Secondary | ICD-10-CM

## 2016-12-21 DIAGNOSIS — F1190 Opioid use, unspecified, uncomplicated: Secondary | ICD-10-CM | POA: Diagnosis not present

## 2016-12-21 DIAGNOSIS — Z79899 Other long term (current) drug therapy: Secondary | ICD-10-CM

## 2016-12-21 DIAGNOSIS — G44329 Chronic post-traumatic headache, not intractable: Secondary | ICD-10-CM

## 2016-12-21 MED ORDER — HYDROCODONE-ACETAMINOPHEN 10-325 MG PO TABS: 1.0000 | ORAL_TABLET | Freq: Three times a day (TID) | ORAL | 0 refills | Status: DC | PRN

## 2016-12-21 MED ORDER — CYCLOBENZAPRINE HCL 10 MG PO TABS: 10.0000 mg | ORAL_TABLET | Freq: Three times a day (TID) | ORAL | 3 refills | Status: DC | PRN

## 2016-12-21 MED ORDER — GABAPENTIN 300 MG PO CAPS: ORAL_CAPSULE | ORAL | 1 refills | Status: DC
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
## 2016-12-21 NOTE — Patient Instructions (Signed)
It was a pleasure seeing you today. Continue every 3 months follow up on chronic pain and routine f/u on hypertension (must be separate appts).

## 2017-03-23 ENCOUNTER — Encounter: Payer: Self-pay | Admitting: Family Medicine

## 2017-03-23 ENCOUNTER — Ambulatory Visit (INDEPENDENT_AMBULATORY_CARE_PROVIDER_SITE_OTHER): Payer: PRIVATE HEALTH INSURANCE | Admitting: Family Medicine

## 2017-03-23 DIAGNOSIS — G8928 Other chronic postprocedural pain: Secondary | ICD-10-CM | POA: Diagnosis not present

## 2017-03-23 DIAGNOSIS — M1990 Unspecified osteoarthritis, unspecified site: Secondary | ICD-10-CM | POA: Diagnosis not present

## 2017-03-23 DIAGNOSIS — M5010 Cervical disc disorder with radiculopathy, unspecified cervical region: Secondary | ICD-10-CM | POA: Diagnosis not present

## 2017-03-23 DIAGNOSIS — M545 Low back pain, unspecified: Secondary | ICD-10-CM

## 2017-03-23 DIAGNOSIS — I10 Essential (primary) hypertension: Secondary | ICD-10-CM | POA: Diagnosis not present

## 2017-03-23 DIAGNOSIS — G44329 Chronic post-traumatic headache, not intractable: Secondary | ICD-10-CM

## 2017-03-23 DIAGNOSIS — M542 Cervicalgia: Secondary | ICD-10-CM

## 2017-03-23 DIAGNOSIS — G8929 Other chronic pain: Secondary | ICD-10-CM | POA: Diagnosis not present

## 2017-03-23 DIAGNOSIS — F1190 Opioid use, unspecified, uncomplicated: Secondary | ICD-10-CM | POA: Diagnosis not present

## 2017-03-23 DIAGNOSIS — G4700 Insomnia, unspecified: Secondary | ICD-10-CM

## 2017-03-23 MED ORDER — NORTRIPTYLINE HCL 50 MG PO CAPS: 100.0000 mg | ORAL_CAPSULE | Freq: Every day | ORAL | 1 refills | Status: DC

## 2017-03-23 MED ORDER — AMLODIPINE BESY-BENAZEPRIL HCL 5-10 MG PO CAPS: 1.0000 | ORAL_CAPSULE | Freq: Every day | ORAL | 1 refills | Status: DC

## 2017-03-23 MED ORDER — NORTRIPTYLINE HCL 50 MG PO CAPS: 50.0000 mg | ORAL_CAPSULE | Freq: Every day | ORAL | 1 refills | Status: DC

## 2017-03-23 MED ORDER — HYDROCODONE-ACETAMINOPHEN 10-325 MG PO TABS: 1.0000 | ORAL_TABLET | Freq: Three times a day (TID) | ORAL | 0 refills | Status: DC | PRN

## 2017-03-23 MED ORDER — GABAPENTIN 400 MG PO CAPS: ORAL_CAPSULE | ORAL | 1 refills | Status: DC
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
## 2017-03-23 NOTE — Patient Instructions (Addendum)
It was a pleasure to see you today.  Start gabapentin 400/400/1200.  Amitriptyline is 100 mg (2 tabs) at night.  Refills on all pain meds and hypertension meds.  If your chest is bothering you please make an appt to be seen asap to further investigate

## 2017-03-24 ENCOUNTER — Ambulatory Visit (INDEPENDENT_AMBULATORY_CARE_PROVIDER_SITE_OTHER): Payer: PRIVATE HEALTH INSURANCE | Admitting: Family Medicine

## 2017-03-24 ENCOUNTER — Encounter: Payer: Self-pay | Admitting: Family Medicine

## 2017-03-24 VITALS — BP 159/95 | HR 94 | Temp 98.4°F | Resp 20 | Wt 207.0 lb

## 2017-03-24 DIAGNOSIS — R079 Chest pain, unspecified: Secondary | ICD-10-CM | POA: Diagnosis not present

## 2017-03-24 LAB — CBC WITH DIFFERENTIAL/PLATELET
Basophils Absolute: 0.1 10*3/uL (ref 0.0–0.1)
Basophils Relative: 1.2 % (ref 0.0–3.0)
Eosinophils Absolute: 0.4 10*3/uL (ref 0.0–0.7)
Eosinophils Relative: 7.2 % — ABNORMAL HIGH (ref 0.0–5.0)
HCT: 45.1 % (ref 39.0–52.0)
Hemoglobin: 15.2 g/dL (ref 13.0–17.0)
Lymphocytes Relative: 28 % (ref 12.0–46.0)
Lymphs Abs: 1.6 10*3/uL (ref 0.7–4.0)
MCHC: 33.6 g/dL (ref 30.0–36.0)
MCV: 94.5 fl (ref 78.0–100.0)
Monocytes Absolute: 0.6 10*3/uL (ref 0.1–1.0)
Monocytes Relative: 10.7 % (ref 3.0–12.0)
Neutro Abs: 3 10*3/uL (ref 1.4–7.7)
Neutrophils Relative %: 52.9 % (ref 43.0–77.0)
Platelets: 174 10*3/uL (ref 150.0–400.0)
RBC: 4.77 Mil/uL (ref 4.22–5.81)
RDW: 13.3 % (ref 11.5–15.5)
WBC: 5.7 10*3/uL (ref 4.0–10.5)

## 2017-03-24 LAB — LIPASE: Lipase: 24 U/L (ref 11.0–59.0)

## 2017-03-24 MED ORDER — OMEPRAZOLE 40 MG PO CPDR: 40.0000 mg | DELAYED_RELEASE_CAPSULE | Freq: Every day | ORAL | 0 refills | Status: DC
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
## 2017-03-24 NOTE — Patient Instructions (Signed)
I am uncertain the cause, potentially many cases for the chest discomfort.  We will rule out lung/GI causes by labs, Xray and medicine trial and altering the diet.  Our next step would be cardiology and sleep study if all labs are normal.   Start  Prilosec today. Please have CXR completed today.    I will call you with results and plan.   If you experience chest pain again and it last > 10 minutes, please got to ED.     Food Choices for Gastroesophageal Reflux Disease, Adult When you have gastroesophageal reflux disease (GERD), the foods you eat and your eating habits are very important. Choosing the right foods can help ease your discomfort. What guidelines do I need to follow?  Choose fruits, vegetables, whole grains, and low-fat dairy products.  Choose low-fat meat, fish, and poultry.  Limit fats such as oils, salad dressings, butter, nuts, and avocado.  Keep a food diary. This helps you identify foods that cause symptoms.  Avoid foods that cause symptoms. These may be different for everyone.  Eat small meals often instead of 3 large meals a day.  Eat your meals slowly, in a place where you are relaxed.  Limit fried foods.  Cook foods using methods other than frying.  Avoid drinking alcohol.  Avoid drinking large amounts of liquids with your meals.  Avoid bending over or lying down until 2-3 hours after eating. What foods are not recommended? These are some foods and drinks that may make your symptoms worse: Vegetables Tomatoes. Tomato juice. Tomato and spaghetti sauce. Chili peppers. Onion and garlic. Horseradish. Fruits Oranges, grapefruit, and lemon (fruit and juice). Meats High-fat meats, fish, and poultry. This includes hot dogs, ribs, ham, sausage, salami, and bacon. Dairy Whole milk and chocolate milk. Sour cream. Cream. Butter. Ice cream. Cream cheese. Drinks Coffee and tea. Bubbly (carbonated) drinks or energy drinks. Condiments Hot sauce. Barbecue  sauce. Sweets/Desserts Chocolate and cocoa. Donuts. Peppermint and spearmint. Fats and Oils High-fat foods. This includes [place] fries and potato chips. Other Vinegar. Strong spices. This includes black pepper, white pepper, red pepper, cayenne, curry powder, cloves, ginger, and chili powder. The items listed above may not be a complete list of foods and drinks to avoid. Contact your dietitian for more information. This information is not intended to replace advice given to you by your health care provider. Make sure you discuss any questions you have with your health care provider. Document Released: 04/05/2012 Document Revised: 03/12/2016 Document Reviewed: 08/09/2013 Elsevier Interactive Patient Education  2017 Elsevier Inc.    Angina Pectoris Angina pectoris, often called angina, is extreme discomfort in the chest, neck, or arm. This is caused by a lack of blood in the middle and thickest layer of the heart wall (myocardium). There are four types of angina:  Stable angina. Stable angina usually occurs in episodes of predictable frequency and duration. It is usually brought on by physical activity, stress, or excitement. Stable angina usually lasts a few minutes and can often be relieved by a medicine that you place under your tongue. This medicine is called sublingual nitroglycerin.  Unstable angina. Unstable angina can occur even when you are doing little or no physical activity. It can even occur while you are sleeping or when you are at rest. It can suddenly increase in severity or frequency. It may not be relieved by sublingual nitroglycerin, and it can last up to 30 minutes.  Microvascular angina. This type of angina is caused by a  disorder of tiny blood vessels called arterioles. Microvascular angina is more common in women. The pain may be more severe and last longer than other types of angina pectoris.  Prinzmetal or variant angina. This type of angina pectoris is rare and usually  occurs when you are doing little or no physical activity. It especially occurs in the early morning hours.  What are the causes? Atherosclerosis is the cause of angina. This is the buildup of fat and cholesterol (plaque) on the inside of the arteries. Over time, the plaque may narrow or block the artery, and this will lessen blood flow to the heart. Plaque can also become weak and break off within a coronary artery to form a clot and cause a sudden blockage. What increases the risk? Risk factors common to both men and women include:  High cholesterol levels.  High blood pressure (hypertension).  Tobacco use.  Diabetes.  Family history of angina.  Obesity.  Lack of exercise.  A diet high in saturated fats.  Women are at greater risk for angina if they are:  Over age 64.  Postmenopausal.  What are the signs or symptoms? Many people do not experience any symptoms during the early stages of angina. As the condition progresses, symptoms common to both men and women may include:  Chest pain. ? The pain can be described as a crushing or squeezing in the chest, or a tightness, pressure, fullness, or heaviness in the chest. ? The pain can last more than a few minutes, or it can stop and recur.  Pain in the arms, neck, jaw, or back.  Unexplained heartburn or indigestion.  Shortness of breath.  Nausea.  Sudden cold sweats.  Sudden light-headedness.  Many women have chest discomfort and some of the other symptoms. However, women often have different (atypical) symptoms, such as:  Fatigue.  Unexplained feelings of nervousness or anxiety.  Unexplained weakness.  Dizziness or fainting.  Sometimes, women may have angina without any symptoms. How is this diagnosed? Tests to diagnose angina may include:  ECG (electrocardiogram).  Exercise stress test. This looks for signs of blockage when the heart is being exercised.  Pharmacologic stress test. This test looks for  signs of blockage when the heart is being stressed with a medicine.  Blood tests.  Coronary angiogram. This is a procedure to look at the coronary arteries to see if there is any blockage.  How is this treated? The treatment of angina may include the following:  Healthy behavioral changes to reduce or control risk factors.  Medicine.  Coronary stenting.A stent helps to keep an artery open.  Coronary angioplasty. This procedure widens a narrowed or blocked artery.  Coronary arterybypass surgery. This will allow your blood to pass the blockage (bypass) to reach your heart.  Follow these instructions at home:  Take medicines only as directed by your health care provider.  Do not take the following medicines unless your health care provider approves: ? Nonsteroidal anti-inflammatory drugs (NSAIDs), such as ibuprofen, naproxen, or celecoxib. ? Vitamin supplements that contain vitamin A, vitamin E, or both. ? Hormone replacement therapy that contains estrogen with or without progestin.  Manage other health conditions such as hypertension and diabetes as directed by your health care provider.  Follow a heart-healthy diet. A dietitian can help to educate you about healthy food options and changes.  Use healthy cooking methods such as roasting, grilling, broiling, baking, poaching, steaming, or stir-frying. Talk to a dietitian to learn more about healthy cooking methods.  Follow an exercise program approved by your health care provider.  Maintain a healthy weight. Lose weight as approved by your health care provider.  Plan rest periods when fatigued.  Learn to manage stress.  Do not use any tobacco products, including cigarettes, chewing tobacco, or electronic cigarettes. If you need help quitting, ask your health care provider.  If you drink alcohol, and your health care provider approves, limit your alcohol intake to no more than 1 drink per day. One drink equals 12 ounces of  beer, 5 ounces of wine, or 1 ounces of hard liquor.  Stop illegal drug use.  Keep all follow-up visits as directed by your health care provider. This is important. Get help right away if:  You have pain in your chest, neck, arm, jaw, stomach, or back that lasts more than a few minutes, is recurring, or is unrelieved by taking sublingualnitroglycerin.  You have profuse sweating without cause.  You have unexplained: ? Heartburn or indigestion. ? Shortness of breath or difficulty breathing. ? Nausea or vomiting. ? Fatigue. ? Feelings of nervousness or anxiety. ? Weakness. ? Diarrhea.  You have sudden light-headedness or dizziness.  You faint. These symptoms may represent a serious problem that is an emergency. Do not wait to see if the symptoms will go away. Get medical help right away. Call your local emergency services (911 in the [place] Do not drive yourself to the hospital. This information is not intended to replace advice given to you by your health care provider. Make sure you discuss any questions you have with your health care provider. Document Released: 10/05/2005 Document Revised: 03/18/2016 Document Reviewed: 02/06/2014 Elsevier Interactive Patient Education  2017 [employer].

## 2017-03-25 ENCOUNTER — Telehealth: Payer: Self-pay | Admitting: Family Medicine

## 2017-03-25 ENCOUNTER — Ambulatory Visit (HOSPITAL_BASED_OUTPATIENT_CLINIC_OR_DEPARTMENT_OTHER)
Admission: RE | Admit: 2017-03-25 | Discharge: 2017-03-25 | Disposition: A | Discharge status: Home / Self Care | Payer: PRIVATE HEALTH INSURANCE | Source: Ambulatory Visit | Attending: Family Medicine | Admitting: Family Medicine

## 2017-03-25 DIAGNOSIS — R079 Chest pain, unspecified: Secondary | ICD-10-CM | POA: Diagnosis present

## 2017-03-25 LAB — D-DIMER, QUANTITATIVE: D-Dimer, Quant: 0.52 mcg/mL FEU — ABNORMAL HIGH (ref <0.50)

## 2017-03-25 NOTE — Telephone Encounter (Signed)
Message left on voicemail to return call.

## 2017-03-25 NOTE — Telephone Encounter (Signed)
Please call pt: - his labs are normal. His d-dimer (the test to rule out blood clot) will show mild elevation if he looks on mychart, but this is age adjusted for people> 50 years and his  is normal for his age. - Please  Remind him to have CXR completed at [facility], he just needs to go to have it, no appt needed/no call to schedule etc. I think he forgot. After I get that result we can decide on next step/

## 2017-03-26 ENCOUNTER — Telehealth: Payer: Self-pay | Admitting: Family Medicine

## 2017-03-26 DIAGNOSIS — R079 Chest pain, unspecified: Secondary | ICD-10-CM

## 2017-03-26 NOTE — Telephone Encounter (Signed)
Please call pt: - his cxr dis not show evidence of heart or lung causes for his chest pain. - I would like to refer him to cariology to be complete given his Fhx of heart disease and his h/o hypertension/hyperlipidemia. - continue the medication (prilosec) and dietary modifications we discussed as well.  - follow up here in 4 weeks, preferably after cardio eval (best). To discuss the need of prilosec. Sooner if worsening. ED if acute chest pain. - cardio will call to schedule.

## 2017-03-26 NOTE — Telephone Encounter (Signed)
Message left on voice mail for patient to return call. 

## 2017-03-29 ENCOUNTER — Encounter: Payer: Self-pay | Admitting: Interventional Cardiology

## 2017-03-29 NOTE — Telephone Encounter (Signed)
Patient notified and verbalized understanding. Patient will call back to schedule appointment after cardiology appt is scheduled.

## 2017-03-30 ENCOUNTER — Ambulatory Visit: Payer: PRIVATE HEALTH INSURANCE | Admitting: Interventional Cardiology

## 2017-03-30 NOTE — Progress Notes (Deleted)
Patient no show

## 2017-03-31 ENCOUNTER — Encounter: Payer: Self-pay | Admitting: *Deleted

## 2017-03-31 NOTE — Telephone Encounter (Signed)
Sent patient message with [facility] and [facility]

## 2017-06-18 ENCOUNTER — Ambulatory Visit (INDEPENDENT_AMBULATORY_CARE_PROVIDER_SITE_OTHER): Payer: PRIVATE HEALTH INSURANCE | Admitting: Family Medicine

## 2017-06-18 ENCOUNTER — Encounter: Payer: Self-pay | Admitting: Family Medicine

## 2017-06-18 VITALS — BP 131/83 | HR 94 | Temp 98.3°F | Resp 20 | Ht 69.0 in | Wt 201.5 lb

## 2017-06-18 DIAGNOSIS — E785 Hyperlipidemia, unspecified: Secondary | ICD-10-CM

## 2017-06-18 DIAGNOSIS — G4700 Insomnia, unspecified: Secondary | ICD-10-CM

## 2017-06-18 DIAGNOSIS — M545 Low back pain: Secondary | ICD-10-CM

## 2017-06-18 DIAGNOSIS — F1190 Opioid use, unspecified, uncomplicated: Secondary | ICD-10-CM

## 2017-06-18 DIAGNOSIS — M1990 Unspecified osteoarthritis, unspecified site: Secondary | ICD-10-CM | POA: Diagnosis not present

## 2017-06-18 DIAGNOSIS — G8928 Other chronic postprocedural pain: Secondary | ICD-10-CM | POA: Diagnosis not present

## 2017-06-18 DIAGNOSIS — M5010 Cervical disc disorder with radiculopathy, unspecified cervical region: Secondary | ICD-10-CM | POA: Diagnosis not present

## 2017-06-18 DIAGNOSIS — M542 Cervicalgia: Secondary | ICD-10-CM

## 2017-06-18 DIAGNOSIS — I10 Essential (primary) hypertension: Secondary | ICD-10-CM | POA: Diagnosis not present

## 2017-06-18 DIAGNOSIS — G8929 Other chronic pain: Secondary | ICD-10-CM | POA: Diagnosis not present

## 2017-06-18 MED ORDER — NORTRIPTYLINE HCL 75 MG PO CAPS: 150.0000 mg | ORAL_CAPSULE | Freq: Every day | ORAL | 1 refills | Status: DC

## 2017-06-18 MED ORDER — HYDROCODONE-ACETAMINOPHEN 10-325 MG PO TABS: 1.0000 | ORAL_TABLET | Freq: Three times a day (TID) | ORAL | 0 refills | Status: DC | PRN

## 2017-06-18 MED ORDER — GABAPENTIN 400 MG PO CAPS: ORAL_CAPSULE | ORAL | 1 refills | Status: DC
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
## 2017-06-18 NOTE — Patient Instructions (Signed)
It was great to see you today.  I am glad you are doing better.   I have refilled your medications for 3 months.    Please help us help you:  [facility] are [facility]. Below you will find basic instructions that you may need to access in the future. Please help us help you by reading the instructions, which cover many of the frequent questions we experience.   Prescription refills and request:  -In order to allow more efficient response time, please call your pharmacy for all refills. They will forward the request electronically to us. This allows for the quickest possible response. Request left on a nurse line can take longer to refill, since these are checked as time allows between office patients and other phone calls.  - refill request can take up to 3-5 working days to complete.  - If request is sent electronically and request is appropiate, it is usually completed in 1-2 business days.  - all patients will need to be seen routinely for all chronic medical conditions requiring prescription medications (see follow-up below). If you are overdue for follow up on your condition, you will be asked to make an appointment and we will call in enough medication to cover you until your appointment (up to 30 days).  - all controlled substances will require a face to face visit to request/refill.  - if you desire your prescriptions to go through a new pharmacy, and have an active script at original pharmacy, you will need to call your pharmacy and have scripts transferred to new pharmacy. This is completed between the pharmacy locations and not by your provider.    Results: If any images or labs were ordered, it can take up to 1 week to get results depending on the test ordered and the lab/facility running and resulting the test. - Normal or stable results, which do not need further discussion, may be released to your mychart immediately with attached  note to you. A call may not be generated for normal results. Please make certain to sign up for mychart. If you have questions on how to activate your mychart you can call the front office.  - If your results need further discussion, our office will attempt to contact you via phone, and if unable to reach you after 2 attempts, we will release your abnormal result to your mychart with instructions.  - All results will be automatically released in mychart after 1 week.  - Your provider will provide you with explanation and instruction on all relevant material in your results. Please keep in mind, results and labs may appear confusing or abnormal to the untrained eye, but it does not mean they are actually abnormal for you personally. If you have any questions about your results that are not covered, or you desire more detailed explanation than what was provided, you should make an appointment with your provider to do so.   Our office handles many outgoing and incoming calls daily. If we have not contacted you within 1 week about your results, please check your mychart to see if there is a message first and if not, then contact our office.  In helping with this matter, you help decrease call volume, and therefore allow us to be able to respond to patients needs more efficiently.   Acute office visits (sick visit):  An acute visit is intended for a new problem and are scheduled in  shorter time slots to allow schedule openings for patients with new problems. This is the appropriate visit to discuss a new problem. In order to provide you with excellent quality medical care with proper time for you to explain your problem, have an exam and receive treatment with instructions, these appointments should be limited to one new problem per visit. If you experience a new problem, in which you desire to be addressed, please make an acute office visit, we save openings on the schedule to accommodate you. Please do not save  your new problem for any other type of visit, let us take care of it properly and quickly for you.   Follow up visits:  Depending on your condition(s) your provider will need to see you routinely in order to provide you with quality care and prescribe medication(s). Most chronic conditions (Example: hypertension, Diabetes, depression/anxiety... etc), require visits a couple times a year. Your provider will instruct you on proper follow up for your personal medical conditions and history. Please make certain to make follow up appointments for your condition as instructed. Failing to do so could result in lapse in your medication treatment/refills. If you request a refill, and are overdue to be seen on a condition, we will always provide you with a 30 day script (once) to allow you time to schedule.    Medicare wellness (well visit): - we have a wonderful Nurse Maudie Mercury), that will meet with you and provide you will yearly medicare wellness visits. These visits should occur yearly (can not be scheduled less than 1 calendar year apart) and cover preventive health, immunizations, advance directives and screenings you are entitled to yearly through your medicare benefits. Do not miss out on your entitled benefits, this is when medicare will pay for these benefits to be ordered for you.  These are strongly encouraged by your provider and is the appropriate type of visit to make certain you are up to date with all preventive health benefits. If you have not had your medicare wellness exam in the last 12 months, please make certain to schedule one by calling the office and schedule your medicare wellness with Maudie Mercury as soon as possible.   Yearly physical (well visit):  - Adults are recommended to be seen yearly for physicals. Check with your insurance and date of your last physical, most insurances require one calendar year between physicals. Physicals include all preventive health topics, screenings, medical exam and  labs that are appropriate for gender/age and history. You may have fasting labs needed at this visit. This is a well visit (not a sick visit), new problems should not be covered during this visit (see acute visit).  - Pediatric patients are seen more frequently when they are younger. Your provider will advise you on well child visit timing that is appropriate for your their age. - This is not a medicare wellness visit. Medicare wellness exams do not have an exam portion to the visit. Some medicare companies allow for a physical, some do not allow a yearly physical. If your medicare allows a yearly physical you can schedule the medicare wellness with our nurse Maudie Mercury and have your physical with your provider after, on the same day. Please check with insurance for your full benefits.   Late Policy/No Shows:  - all new patients should arrive 15-30 minutes earlier than appointment to allow [place] time  to  obtain all personal demographics,  insurance information and for you to complete office paperwork. - All established patients  should arrive 10-15 minutes earlier than appointment time to update all information and be checked in .  - In our best efforts to run on time, if you are late for your appointment you will be asked to either reschedule or if able, we will work you back into the schedule. There will be a wait time to work you back in the schedule,  depending on availability.  - If you are unable to make it to your appointment as scheduled, please call 24 hours ahead of time to allow [place] to fill the time slot with someone else who needs to be seen. If you do not cancel your appointment ahead of time, you may be charged a no show fee.

## 2017-06-22 ENCOUNTER — Ambulatory Visit: Payer: PRIVATE HEALTH INSURANCE | Admitting: Family Medicine

## 2017-09-06 ENCOUNTER — Encounter: Payer: Self-pay | Admitting: Family Medicine

## 2017-09-06 ENCOUNTER — Ambulatory Visit: Payer: PRIVATE HEALTH INSURANCE | Admitting: Family Medicine

## 2017-09-06 VITALS — BP 140/90 | HR 80 | Temp 98.3°F | Resp 20 | Ht 69.0 in | Wt 205.8 lb

## 2017-09-06 DIAGNOSIS — Z8781 Personal history of (healed) traumatic fracture: Secondary | ICD-10-CM

## 2017-09-06 DIAGNOSIS — G8929 Other chronic pain: Secondary | ICD-10-CM | POA: Diagnosis not present

## 2017-09-06 DIAGNOSIS — G44329 Chronic post-traumatic headache, not intractable: Secondary | ICD-10-CM

## 2017-09-06 DIAGNOSIS — G8928 Other chronic postprocedural pain: Secondary | ICD-10-CM

## 2017-09-06 DIAGNOSIS — G4700 Insomnia, unspecified: Secondary | ICD-10-CM

## 2017-09-06 DIAGNOSIS — E782 Mixed hyperlipidemia: Secondary | ICD-10-CM

## 2017-09-06 DIAGNOSIS — F1190 Opioid use, unspecified, uncomplicated: Secondary | ICD-10-CM

## 2017-09-06 DIAGNOSIS — E669 Obesity, unspecified: Secondary | ICD-10-CM | POA: Diagnosis not present

## 2017-09-06 DIAGNOSIS — I10 Essential (primary) hypertension: Secondary | ICD-10-CM | POA: Diagnosis not present

## 2017-09-06 DIAGNOSIS — E785 Hyperlipidemia, unspecified: Secondary | ICD-10-CM

## 2017-09-06 DIAGNOSIS — M545 Low back pain, unspecified: Secondary | ICD-10-CM

## 2017-09-06 DIAGNOSIS — M1990 Unspecified osteoarthritis, unspecified site: Secondary | ICD-10-CM | POA: Diagnosis not present

## 2017-09-06 DIAGNOSIS — M5010 Cervical disc disorder with radiculopathy, unspecified cervical region: Secondary | ICD-10-CM

## 2017-09-06 DIAGNOSIS — M542 Cervicalgia: Secondary | ICD-10-CM

## 2017-09-06 MED ORDER — NORTRIPTYLINE HCL 75 MG PO CAPS: 150.0000 mg | ORAL_CAPSULE | Freq: Every day | ORAL | 1 refills | Status: DC

## 2017-09-06 MED ORDER — GABAPENTIN 400 MG PO CAPS: ORAL_CAPSULE | ORAL | 1 refills | Status: DC

## 2017-09-06 MED ORDER — HYDROCODONE-ACETAMINOPHEN 10-325 MG PO TABS: 1.0000 | ORAL_TABLET | Freq: Three times a day (TID) | ORAL | 0 refills | Status: DC | PRN

## 2017-09-06 NOTE — Patient Instructions (Signed)
It was a pleasure to see you today.  Follow up in 3 months on chronic pain.   See you next week for physical. If BP is still borderline or high at that visit we will increase your dose    Please help Brian Murray help you:  We are honored [name]. Below you will find basic instructions that you may need to access in the future. Please help Brian Murray help you by reading the instructions, which cover many of the frequent questions we experience.   Prescription refills and request:  -In order to allow more efficient response time, please call your pharmacy for all refills. They will forward the request electronically to Brian Murray. This allows for the quickest possible response. Request left on a nurse line can take longer to refill, since these are checked as time allows between office patients and other phone calls.  - refill request can take up to 3-5 working days to complete.  - If request is sent electronically and request is appropiate, it is usually completed in 1-2 business days.  - all patients will need to be seen routinely for all chronic medical conditions requiring prescription medications (see follow-up below). If you are overdue for follow up on your condition, you will be asked to make an appointment and we will call in enough medication to cover you until your appointment (up to 30 days).  - all controlled substances will require a face to face visit to request/refill.  - if you desire your prescriptions to go through a new pharmacy, and have an active script at original pharmacy, you will need to call your pharmacy and have scripts transferred to new pharmacy. This is completed between the pharmacy locations and not by your provider.    Results: If any images or labs were ordered, it can take up to 1 week to get results depending on the test ordered and the lab/facility running and resulting the test. - Normal or stable results, which do not need further  discussion, may be released to your mychart immediately with attached note to you. A call may not be generated for normal results. Please make certain to sign up for mychart. If you have questions on how to activate your mychart you can call the front office.  - If your results need further discussion, our office will attempt to contact you via phone, and if unable to reach you after 2 attempts, we will release your abnormal result to your mychart with instructions.  - All results will be automatically released in mychart after 1 week.  - Your provider will provide you with explanation and instruction on all relevant material in your results. Please keep in mind, results and labs may appear confusing or abnormal to the untrained eye, but it does not mean they are actually abnormal for you personally. If you have any questions about your results that are not covered, or you desire more detailed explanation than what was provided, you should make an appointment with your provider to do so.   Our office handles many outgoing and incoming calls daily. If we have not contacted you within 1 week about your results, please check your mychart to see if there is a message first and if not, then contact our office.  In helping with this matter, you help decrease call volume, and therefore allow Brian Murray to be able to respond to patients needs more efficiently.   Acute office visits (sick  visit):  An acute visit is intended for a new problem and are scheduled in shorter time slots to allow schedule openings for patients with new problems. This is the appropriate visit to discuss a new problem. In order to provide you with excellent quality medical care with proper time for you to explain your problem, have an exam and receive treatment with instructions, these appointments should be limited to one new problem per visit. If you experience a new problem, in which you desire to be addressed, please make an acute office visit,  we save openings on the schedule to accommodate you. Please do not save your new problem for any other type of visit, let Brian Murray take care of it properly and quickly for you.   Follow up visits:  Depending on your condition(s) your provider will need to see you routinely in order to provide you with quality care and prescribe medication(s). Most chronic conditions (Example: hypertension, Diabetes, depression/anxiety... etc), require visits a couple times a year. Your provider will instruct you on proper follow up for your personal medical conditions and history. Please make certain to make follow up appointments for your condition as instructed. Failing to do so could result in lapse in your medication treatment/refills. If you request a refill, and are overdue to be seen on a condition, we will always provide you with a 30 day script (once) to allow you time to schedule.    Medicare wellness (well visit): - we have a wonderful Nurse Selena Batten(Kim), that will meet with you and provide you will yearly medicare wellness visits. These visits should occur yearly (can not be scheduled less than 1 calendar year apart) and cover preventive health, immunizations, advance directives and screenings you are entitled to yearly through your medicare benefits. Do not miss out on your entitled benefits, this is when medicare will pay for these benefits to be ordered for you.  These are strongly encouraged by your provider and is the appropriate type of visit to make certain you are up to date with all preventive health benefits. If you have not had your medicare wellness exam in the last 12 months, please make certain to schedule one by calling the office and schedule your medicare wellness with [name] as soon as possible.   Yearly physical (well visit):  - Adults are recommended to be seen yearly for physicals. Check with your insurance and date of your last physical, most insurances require one calendar year between physicals.  Physicals include all preventive health topics, screenings, medical exam and labs that are appropriate for gender/age and history. You may have fasting labs needed at this visit. This is a well visit (not a sick visit), new problems should not be covered during this visit (see acute visit).  - Pediatric patients are seen more frequently when they are younger. Your provider will advise you on well child visit timing that is appropriate for your their age. - This is not a medicare wellness visit. Medicare wellness exams do not have an exam portion to the visit. Some medicare companies allow for a physical, some do not allow a yearly physical. If your medicare allows a yearly physical you can schedule the medicare wellness with our nurse [name] and have your physical with your provider after, on the same day. Please check with insurance for your full benefits.   Late Policy/No Shows:  - all new patients should arrive 15-30 minutes earlier than appointment to allow Brian Murray time  to  obtain all personal  demographics,  insurance information and for you to complete office paperwork. - All established patients should arrive 10-15 minutes earlier than appointment time to update all information and be checked in .  - In our best efforts to run on time, if you are late for your appointment you will be asked to either reschedule or if able, we will work you back into the schedule. There will be a wait time to work you back in the schedule,  depending on availability.  - If you are unable to make it to your appointment as scheduled, please call 24 hours ahead of time to allow [place] to fill the time slot with someone else who needs to be seen. If you do not cancel your appointment ahead of time, you may be charged a no show fee.
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
## 2017-09-11 LAB — PAIN MGMT, PROFILE 8 W/CONF, U
6 Acetylmorphine: NEGATIVE ng/mL (ref <10)
Alcohol Metabolites: POSITIVE ng/mL — AB (ref <500)
Alphahydroxyalprazolam: NEGATIVE ng/mL (ref <25)
Alphahydroxymidazolam: NEGATIVE ng/mL (ref <50)
Alphahydroxytriazolam: NEGATIVE ng/mL (ref <50)
Aminoclonazepam: NEGATIVE ng/mL (ref <25)
Amphetamines: NEGATIVE ng/mL (ref <500)
Benzodiazepines: NEGATIVE ng/mL (ref <100)
Buprenorphine, Urine: NEGATIVE ng/mL (ref <5)
Cocaine Metabolite: NEGATIVE ng/mL (ref <150)
Codeine: NEGATIVE ng/mL (ref <50)
Creatinine: 56.1 mg/dL
Ethyl Glucuronide (ETG): 11389 ng/mL — ABNORMAL HIGH (ref <500)
Ethyl Sulfate (ETS): 2702 ng/mL — ABNORMAL HIGH (ref <100)
Hydrocodone: 834 ng/mL — ABNORMAL HIGH (ref <50)
Hydromorphone: 131 ng/mL — ABNORMAL HIGH (ref <50)
Hydroxyethylflurazepam: NEGATIVE ng/mL (ref <50)
Lorazepam: NEGATIVE ng/mL (ref <50)
MDMA: NEGATIVE ng/mL (ref <500)
Marijuana Metabolite: NEGATIVE ng/mL (ref <20)
Morphine: NEGATIVE ng/mL (ref <50)
Nordiazepam: NEGATIVE ng/mL (ref <50)
Norhydrocodone: 783 ng/mL — ABNORMAL HIGH (ref <50)
Opiates: POSITIVE ng/mL — AB (ref <100)
Oxazepam: NEGATIVE ng/mL (ref <50)
Oxidant: NEGATIVE ug/mL (ref <200)
Oxycodone: NEGATIVE ng/mL (ref <100)
Temazepam: NEGATIVE ng/mL (ref <50)
pH: 6.28 (ref 4.5–9.0)

## 2017-09-13 ENCOUNTER — Telehealth: Payer: Self-pay | Admitting: Family Medicine

## 2017-09-13 NOTE — Telephone Encounter (Signed)
Please call pt: - his UDS was consistent with his medications. He did have alcohol present in his system. I would strongly advise for him not to consume alcohol with use of his medications. The mixture can cause serious side effects and impairment.

## 2017-09-13 NOTE — Telephone Encounter (Signed)
Spoke with patient reviewed lab results and instructions. Patient verbalized understanding. 

## 2017-09-21 ENCOUNTER — Encounter: Payer: PRIVATE HEALTH INSURANCE | Admitting: Family Medicine

## 2017-12-06 ENCOUNTER — Other Ambulatory Visit: Payer: Self-pay | Admitting: *Deleted

## 2017-12-06 DIAGNOSIS — E785 Hyperlipidemia, unspecified: Secondary | ICD-10-CM

## 2017-12-06 MED ORDER — LOVASTATIN 40 MG PO TABS: 80.0000 mg | ORAL_TABLET | Freq: Every day | ORAL | 1 refills | Status: DC

## 2018-01-13 ENCOUNTER — Encounter: Payer: Self-pay | Admitting: Family Medicine

## 2018-01-13 ENCOUNTER — Ambulatory Visit: Payer: PRIVATE HEALTH INSURANCE | Admitting: Family Medicine

## 2018-01-13 VITALS — BP 125/81 | HR 96 | Temp 97.3°F | Ht 69.0 in | Wt 197.8 lb

## 2018-01-13 DIAGNOSIS — Z79899 Other long term (current) drug therapy: Secondary | ICD-10-CM | POA: Diagnosis not present

## 2018-01-13 DIAGNOSIS — F1190 Opioid use, unspecified, uncomplicated: Secondary | ICD-10-CM

## 2018-01-13 DIAGNOSIS — M545 Low back pain, unspecified: Secondary | ICD-10-CM

## 2018-01-13 DIAGNOSIS — M542 Cervicalgia: Secondary | ICD-10-CM

## 2018-01-13 DIAGNOSIS — I10 Essential (primary) hypertension: Secondary | ICD-10-CM | POA: Diagnosis not present

## 2018-01-13 DIAGNOSIS — G8929 Other chronic pain: Secondary | ICD-10-CM

## 2018-01-13 DIAGNOSIS — M5010 Cervical disc disorder with radiculopathy, unspecified cervical region: Secondary | ICD-10-CM

## 2018-01-13 DIAGNOSIS — Z8781 Personal history of (healed) traumatic fracture: Secondary | ICD-10-CM | POA: Diagnosis not present

## 2018-01-13 DIAGNOSIS — M1990 Unspecified osteoarthritis, unspecified site: Secondary | ICD-10-CM | POA: Diagnosis not present

## 2018-01-13 DIAGNOSIS — E782 Mixed hyperlipidemia: Secondary | ICD-10-CM | POA: Diagnosis not present

## 2018-01-13 DIAGNOSIS — G4700 Insomnia, unspecified: Secondary | ICD-10-CM

## 2018-01-13 DIAGNOSIS — E785 Hyperlipidemia, unspecified: Secondary | ICD-10-CM | POA: Diagnosis not present

## 2018-01-13 DIAGNOSIS — G8928 Other chronic postprocedural pain: Secondary | ICD-10-CM | POA: Diagnosis not present

## 2018-01-13 LAB — CBC WITH DIFFERENTIAL/PLATELET
Basophils Absolute: 0.1 10*3/uL (ref 0.0–0.1)
Basophils Relative: 1 % (ref 0.0–3.0)
Eosinophils Absolute: 0.3 10*3/uL (ref 0.0–0.7)
Eosinophils Relative: 4.8 % (ref 0.0–5.0)
HCT: 46.5 % (ref 39.0–52.0)
Hemoglobin: 15.5 g/dL (ref 13.0–17.0)
Lymphocytes Relative: 26.6 % (ref 12.0–46.0)
Lymphs Abs: 1.6 10*3/uL (ref 0.7–4.0)
MCHC: 33.3 g/dL (ref 30.0–36.0)
MCV: 95.2 fl (ref 78.0–100.0)
Monocytes Absolute: 0.6 10*3/uL (ref 0.1–1.0)
Monocytes Relative: 10.4 % (ref 3.0–12.0)
Neutro Abs: 3.4 10*3/uL (ref 1.4–7.7)
Neutrophils Relative %: 57.2 % (ref 43.0–77.0)
Platelets: 161 10*3/uL (ref 150.0–400.0)
RBC: 4.89 Mil/uL (ref 4.22–5.81)
RDW: 13.2 % (ref 11.5–15.5)
WBC: 5.9 10*3/uL (ref 4.0–10.5)

## 2018-01-13 LAB — COMPREHENSIVE METABOLIC PANEL
ALT: 47 U/L (ref 0–53)
AST: 49 U/L — ABNORMAL HIGH (ref 0–37)
Albumin: 4.5 g/dL (ref 3.5–5.2)
Alkaline Phosphatase: 56 U/L (ref 39–117)
BUN: 13 mg/dL (ref 6–23)
CO2: 29 mEq/L (ref 19–32)
Calcium: 9.8 mg/dL (ref 8.4–10.5)
Chloride: 105 mEq/L (ref 96–112)
Creatinine, Ser: 0.87 mg/dL (ref 0.40–1.50)
GFR: 93.82 mL/min (ref >60.00)
Glucose, Bld: 122 mg/dL — ABNORMAL HIGH (ref 70–99)
Potassium: 4.8 mEq/L (ref 3.5–5.1)
Sodium: 141 mEq/L (ref 135–145)
Total Bilirubin: 0.7 mg/dL (ref 0.2–1.2)
Total Protein: 7.2 g/dL (ref 6.0–8.3)

## 2018-01-13 LAB — TSH: TSH: 1.79 u[IU]/mL (ref 0.35–4.50)

## 2018-01-13 LAB — LIPID PANEL
Cholesterol: 118 mg/dL (ref 0–200)
HDL: 41.9 mg/dL (ref >39.00)
LDL Cholesterol: 60 mg/dL (ref 0–99)
NonHDL: 76.2
Total CHOL/HDL Ratio: 3
Triglycerides: 79 mg/dL (ref 0.0–149.0)
VLDL: 15.8 mg/dL (ref 0.0–40.0)

## 2018-01-13 MED ORDER — NORTRIPTYLINE HCL 75 MG PO CAPS: 150.0000 mg | ORAL_CAPSULE | Freq: Every day | ORAL | 1 refills | Status: DC

## 2018-01-13 MED ORDER — GABAPENTIN 400 MG PO CAPS: ORAL_CAPSULE | ORAL | 1 refills | Status: DC

## 2018-01-13 MED ORDER — CYCLOBENZAPRINE HCL 10 MG PO TABS: 10.0000 mg | ORAL_TABLET | Freq: Three times a day (TID) | ORAL | 3 refills | Status: DC | PRN

## 2018-01-13 MED ORDER — AMLODIPINE BESY-BENAZEPRIL HCL 5-10 MG PO CAPS: 1.0000 | ORAL_CAPSULE | Freq: Every day | ORAL | 1 refills | Status: DC

## 2018-01-13 MED ORDER — HYDROCODONE-ACETAMINOPHEN 10-325 MG PO TABS: 1.0000 | ORAL_TABLET | Freq: Three times a day (TID) | ORAL | 0 refills | Status: DC | PRN

## 2018-01-13 NOTE — Patient Instructions (Signed)
I have refilled your medications today.  I will call you with lab results once available.   I am glad you are doing well. Have fun on your trip.   Please help [place] help you:  We are honored you have chosen [facility] for your Primary Care home. Below you will find basic instructions that you may need to access in the future. Please help [place] help you by reading the instructions, which cover many of the frequent questions we experience.   Prescription refills and request:  -In order to allow more efficient response time, please call your pharmacy for all refills. They will forward the request electronically to [place]. This allows for the quickest possible response. Request left on a nurse line can take longer to refill, since these are checked as time allows between office patients and other phone calls.  - refill request can take up to 3-5 working days to complete.  - If request is sent electronically and request is appropiate, it is usually completed in 1-2 business days.  - all patients will need to be seen routinely for all chronic medical conditions requiring prescription medications (see follow-up below). If you are overdue for follow up on your condition, you will be asked to make an appointment and we will call in enough medication to cover you until your appointment (up to 30 days).  - all controlled substances will require a face to face visit to request/refill.  - if you desire your prescriptions to go through a new pharmacy, and have an active script at original pharmacy, you will need to call your pharmacy and have scripts transferred to new pharmacy. This is completed between the pharmacy locations and not by your provider.    Results: If any images or labs were ordered, it can take up to 1 week to get results depending on the test ordered and the lab/facility running and resulting the test. - Normal or stable results, which do not need further discussion, may be released to your  mychart immediately with attached note to you. A call may not be generated for normal results. Please make certain to sign up for mychart. If you have questions on how to activate your mychart you can call the front office.  - If your results need further discussion, our office will attempt to contact you via phone, and if unable to reach you after 2 attempts, we will release your abnormal result to your mychart with instructions.  - All results will be automatically released in mychart after 1 week.  - Your provider will provide you with explanation and instruction on all relevant material in your results. Please keep in mind, results and labs may appear confusing or abnormal to the untrained eye, but it does not mean they are actually abnormal for you personally. If you have any questions about your results that are not covered, or you desire more detailed explanation than what was provided, you should make an appointment with your provider to do so.   Our office handles many outgoing and incoming calls daily. If we have not contacted you within 1 week about your results, please check your mychart to see if there is a message first and if not, then contact our office.  In helping with this matter, you help decrease call volume, and therefore allow [place] to be able to respond to patients needs more efficiently.   Acute office visits (sick visit):  An acute visit is intended for a new problem  and are scheduled in shorter time slots to allow schedule openings for patients with new problems. This is the appropriate visit to discuss a new problem. In order to provide you with excellent quality medical care with proper time for you to explain your problem, have an exam and receive treatment with instructions, these appointments should be limited to one new problem per visit. If you experience a new problem, in which you desire to be addressed, please make an acute office visit, we save openings on the schedule to  accommodate you. Please do not save your new problem for any other type of visit, let us take care of it properly and quickly for you.   Follow up visits:  Depending on your condition(s) your provider will need to see you routinely in order to provide you with quality care and prescribe medication(s). Most chronic conditions (Example: hypertension, Diabetes, depression/anxiety... etc), require visits a couple times a year. Your provider will instruct you on proper follow up for your personal medical conditions and history. Please make certain to make follow up appointments for your condition as instructed. Failing to do so could result in lapse in your medication treatment/refills. If you request a refill, and are overdue to be seen on a condition, we will always provide you with a 30 day script (once) to allow you time to schedule.    Medicare wellness (well visit): - we have a wonderful Nurse Maudie Mercury), that will meet with you and provide you will yearly medicare wellness visits. These visits should occur yearly (can not be scheduled less than 1 calendar year apart) and cover preventive health, immunizations, advance directives and screenings you are entitled to yearly through your medicare benefits. Do not miss out on your entitled benefits, this is when medicare will pay for these benefits to be ordered for you.  These are strongly encouraged by your provider and is the appropriate type of visit to make certain you are up to date with all preventive health benefits. If you have not had your medicare wellness exam in the last 12 months, please make certain to schedule one by calling the office and schedule your medicare wellness with Maudie Mercury as soon as possible.   Yearly physical (well visit):  - Adults are recommended to be seen yearly for physicals. Check with your insurance and date of your last physical, most insurances require one calendar year between physicals. Physicals include all preventive health  topics, screenings, medical exam and labs that are appropriate for gender/age and history. You may have fasting labs needed at this visit. This is a well visit (not a sick visit), new problems should not be covered during this visit (see acute visit).  - Pediatric patients are seen more frequently when they are younger. Your provider will advise you on well child visit timing that is appropriate for your their age. - This is not a medicare wellness visit. Medicare wellness exams do not have an exam portion to the visit. Some medicare companies allow for a physical, some do not allow a yearly physical. If your medicare allows a yearly physical you can schedule the medicare wellness with our nurse Maudie Mercury and have your physical with your provider after, on the same day. Please check with insurance for your full benefits.   Late Policy/No Shows:  - all new patients should arrive 15-30 minutes earlier than appointment to allow [place] time  to  obtain all personal demographics,  insurance information and for you to complete office paperwork. -  All established patients should arrive 10-15 minutes earlier than appointment time to update all information and be checked in .  - In our best efforts to run on time, if you are late for your appointment you will be asked to either reschedule or if able, we will work you back into the schedule. There will be a wait time to work you back in the schedule,  depending on availability.  - If you are unable to make it to your appointment as scheduled, please call 24 hours ahead of time to allow us to fill the time slot with someone else who needs to be seen. If you do not cancel your appointment ahead of time, you may be charged a no show fee.
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
## 2018-05-09 ENCOUNTER — Encounter: Payer: Self-pay | Admitting: Family Medicine

## 2018-05-09 ENCOUNTER — Ambulatory Visit: Payer: PRIVATE HEALTH INSURANCE | Admitting: Family Medicine

## 2018-05-09 VITALS — BP 135/84 | HR 90 | Temp 99.0°F | Resp 20 | Ht 69.0 in | Wt 191.0 lb

## 2018-05-09 DIAGNOSIS — F1190 Opioid use, unspecified, uncomplicated: Secondary | ICD-10-CM | POA: Diagnosis not present

## 2018-05-09 DIAGNOSIS — G8929 Other chronic pain: Secondary | ICD-10-CM | POA: Diagnosis not present

## 2018-05-09 DIAGNOSIS — G8928 Other chronic postprocedural pain: Secondary | ICD-10-CM | POA: Diagnosis not present

## 2018-05-09 DIAGNOSIS — M542 Cervicalgia: Secondary | ICD-10-CM | POA: Diagnosis not present

## 2018-05-09 DIAGNOSIS — M545 Low back pain, unspecified: Secondary | ICD-10-CM

## 2018-05-09 DIAGNOSIS — Z79899 Other long term (current) drug therapy: Secondary | ICD-10-CM

## 2018-05-09 DIAGNOSIS — E785 Hyperlipidemia, unspecified: Secondary | ICD-10-CM

## 2018-05-09 DIAGNOSIS — G4700 Insomnia, unspecified: Secondary | ICD-10-CM | POA: Diagnosis not present

## 2018-05-09 DIAGNOSIS — M1990 Unspecified osteoarthritis, unspecified site: Secondary | ICD-10-CM | POA: Diagnosis not present

## 2018-05-09 DIAGNOSIS — M5010 Cervical disc disorder with radiculopathy, unspecified cervical region: Secondary | ICD-10-CM

## 2018-05-09 DIAGNOSIS — I10 Essential (primary) hypertension: Secondary | ICD-10-CM

## 2018-05-09 MED ORDER — GABAPENTIN 400 MG PO CAPS: ORAL_CAPSULE | ORAL | 1 refills | Status: DC

## 2018-05-09 MED ORDER — NORTRIPTYLINE HCL 75 MG PO CAPS: 150.0000 mg | ORAL_CAPSULE | Freq: Every day | ORAL | 1 refills | Status: DC

## 2018-05-09 MED ORDER — HYDROCODONE-ACETAMINOPHEN 10-325 MG PO TABS: 1.0000 | ORAL_TABLET | Freq: Three times a day (TID) | ORAL | 0 refills | Status: DC | PRN

## 2018-05-09 MED ORDER — AMLODIPINE BESY-BENAZEPRIL HCL 5-10 MG PO CAPS: 1.0000 | ORAL_CAPSULE | Freq: Every day | ORAL | 1 refills | Status: DC

## 2018-05-09 MED ORDER — LOVASTATIN 40 MG PO TABS: 80.0000 mg | ORAL_TABLET | Freq: Every day | ORAL | 1 refills | Status: DC

## 2018-05-09 NOTE — Patient Instructions (Signed)
You look good. Keep up the good work on the weight loss.    Please help [place] help you:  We are honored you have chosen [facility] for your Primary Care home. Below you will find basic instructions that you may need to access in the future. Please help [place] help you by reading the instructions, which cover many of the frequent questions we experience.   Prescription refills and request:  -In order to allow more efficient response time, please call your pharmacy for all refills. They will forward the request electronically to [place]. This allows for the quickest possible response. Request left on a nurse line can take longer to refill, since these are checked as time allows between office patients and other phone calls.  - refill request can take up to 3-5 working days to complete.  - If request is sent electronically and request is appropiate, it is usually completed in 1-2 business days.  - all patients will need to be seen routinely for all chronic medical conditions requiring prescription medications (see follow-up below). If you are overdue for follow up on your condition, you will be asked to make an appointment and we will call in enough medication to cover you until your appointment (up to 30 days).  - all controlled substances will require a face to face visit to request/refill.  - if you desire your prescriptions to go through a new pharmacy, and have an active script at original pharmacy, you will need to call your pharmacy and have scripts transferred to new pharmacy. This is completed between the pharmacy locations and not by your provider.    Results: If any images or labs were ordered, it can take up to 1 week to get results depending on the test ordered and the lab/facility running and resulting the test. - Normal or stable results, which do not need further discussion, may be released to your mychart immediately with attached note to you. A call may not be generated for normal results.  Please make certain to sign up for mychart. If you have questions on how to activate your mychart you can call the front office.  - If your results need further discussion, our office will attempt to contact you via phone, and if unable to reach you after 2 attempts, we will release your abnormal result to your mychart with instructions.  - All results will be automatically released in mychart after 1 week.  - Your provider will provide you with explanation and instruction on all relevant material in your results. Please keep in mind, results and labs may appear confusing or abnormal to the untrained eye, but it does not mean they are actually abnormal for you personally. If you have any questions about your results that are not covered, or you desire more detailed explanation than what was provided, you should make an appointment with your provider to do so.   Our office handles many outgoing and incoming calls daily. If we have not contacted you within 1 week about your results, please check your mychart to see if there is a message first and if not, then contact our office.  In helping with this matter, you help decrease call volume, and therefore allow [place] to be able to respond to patients needs more efficiently.   Acute office visits (sick visit):  An acute visit is intended for a new problem and are scheduled in shorter time slots to allow schedule openings for patients with new problems. This  is the appropriate visit to discuss a new problem. Problems will not be addressed by phone call or Echart message. Appointment is needed if requesting treatment. In order to provide you with excellent quality medical care with proper time for you to explain your problem, have an exam and receive treatment with instructions, these appointments should be limited to one new problem per visit. If you experience a new problem, in which you desire to be addressed, please make an acute office visit, we save openings on  the schedule to accommodate you. Please do not save your new problem for any other type of visit, let us take care of it properly and quickly for you.   Follow up visits:  Depending on your condition(s) your provider will need to see you routinely in order to provide you with quality care and prescribe medication(s). Most chronic conditions (Example: hypertension, Diabetes, depression/anxiety... etc), require visits a couple times a year. Your provider will instruct you on proper follow up for your personal medical conditions and history. Please make certain to make follow up appointments for your condition as instructed. Failing to do so could result in lapse in your medication treatment/refills. If you request a refill, and are overdue to be seen on a condition, we will always provide you with a 30 day script (once) to allow you time to schedule.    Medicare wellness (well visit): - we have a wonderful Nurse Selena Batten(Kim), that will meet with you and provide you will yearly medicare wellness visits. These visits should occur yearly (can not be scheduled less than 1 calendar year apart) and cover preventive health, immunizations, advance directives and screenings you are entitled to yearly through your medicare benefits. Do not miss out on your entitled benefits, this is when medicare will pay for these benefits to be ordered for you.  These are strongly encouraged by your provider and is the appropriate type of visit to make certain you are up to date with all preventive health benefits. If you have not had your medicare wellness exam in the last 12 months, please make certain to schedule one by calling the office and schedule your medicare wellness with [name] as soon as possible.   Yearly physical (well visit):  - Adults are recommended to be seen yearly for physicals. Check with your insurance and date of your last physical, most insurances require one calendar year between physicals. Physicals include all  preventive health topics, screenings, medical exam and labs that are appropriate for gender/age and history. You may have fasting labs needed at this visit. This is a well visit (not a sick visit), new problems should not be covered during this visit (see acute visit).  - Pediatric patients are seen more frequently when they are younger. Your provider will advise you on well child visit timing that is appropriate for your their age. - This is not a medicare wellness visit. Medicare wellness exams do not have an exam portion to the visit. Some medicare companies allow for a physical, some do not allow a yearly physical. If your medicare allows a yearly physical you can schedule the medicare wellness with our nurse [name] and have your physical with your provider after, on the same day. Please check with insurance for your full benefits.   Late Policy/No Shows:  - all new patients should arrive 15-30 minutes earlier than appointment to allow us time  to  obtain all personal demographics,  insurance information and for you to complete office paperwork. -  All established patients should arrive 10-15 minutes earlier than appointment time to update all information and be checked in .  - In our best efforts to run on time, if you are late for your appointment you will be asked to either reschedule or if able, we will work you back into the schedule. There will be a wait time to work you back in the schedule,  depending on availability.  - If you are unable to make it to your appointment as scheduled, please call 24 hours ahead of time to allow [place] to fill the time slot with someone else who needs to be seen. If you do not cancel your appointment ahead of time, you may be charged a no show fee.
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
## 2018-08-10 ENCOUNTER — Ambulatory Visit: Payer: PRIVATE HEALTH INSURANCE | Admitting: Family Medicine

## 2018-08-10 ENCOUNTER — Encounter: Payer: Self-pay | Admitting: Family Medicine

## 2018-08-10 ENCOUNTER — Telehealth: Payer: Self-pay | Admitting: Family Medicine

## 2018-08-10 VITALS — BP 134/87 | HR 72 | Resp 16 | Ht 69.0 in | Wt 194.0 lb

## 2018-08-10 DIAGNOSIS — Z8781 Personal history of (healed) traumatic fracture: Secondary | ICD-10-CM

## 2018-08-10 DIAGNOSIS — F1190 Opioid use, unspecified, uncomplicated: Secondary | ICD-10-CM | POA: Diagnosis not present

## 2018-08-10 DIAGNOSIS — E785 Hyperlipidemia, unspecified: Secondary | ICD-10-CM

## 2018-08-10 DIAGNOSIS — G4700 Insomnia, unspecified: Secondary | ICD-10-CM | POA: Diagnosis not present

## 2018-08-10 DIAGNOSIS — G8928 Other chronic postprocedural pain: Secondary | ICD-10-CM

## 2018-08-10 DIAGNOSIS — Z23 Encounter for immunization: Secondary | ICD-10-CM | POA: Diagnosis not present

## 2018-08-10 DIAGNOSIS — I10 Essential (primary) hypertension: Secondary | ICD-10-CM | POA: Diagnosis not present

## 2018-08-10 DIAGNOSIS — M545 Low back pain, unspecified: Secondary | ICD-10-CM

## 2018-08-10 DIAGNOSIS — M5010 Cervical disc disorder with radiculopathy, unspecified cervical region: Secondary | ICD-10-CM

## 2018-08-10 DIAGNOSIS — G8929 Other chronic pain: Secondary | ICD-10-CM

## 2018-08-10 DIAGNOSIS — M542 Cervicalgia: Secondary | ICD-10-CM

## 2018-08-10 DIAGNOSIS — M1990 Unspecified osteoarthritis, unspecified site: Secondary | ICD-10-CM

## 2018-08-10 MED ORDER — LOVASTATIN 40 MG PO TABS: 80.0000 mg | ORAL_TABLET | Freq: Every day | ORAL | 0 refills | Status: DC

## 2018-08-10 MED ORDER — HYDROCODONE-ACETAMINOPHEN 10-325 MG PO TABS: 1.0000 | ORAL_TABLET | Freq: Three times a day (TID) | ORAL | 0 refills | Status: DC | PRN
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
## 2018-08-10 NOTE — Patient Instructions (Signed)
I am glad you are doing well.  Refills on meds today. Follow in chronic conditions in 3 months.  Schedule a physical before your birthday.    Please help [place] help you:  We are honored you have chosen [facility] for your Primary Care home. Below you will find basic instructions that you may need to access in the future. Please help [place] help you by reading the instructions, which cover many of the frequent questions we experience.   Prescription refills and request:  -In order to allow more efficient response time, please call your pharmacy for all refills. They will forward the request electronically to [place]. This allows for the quickest possible response. Request left on a nurse line can take longer to refill, since these are checked as time allows between office patients and other phone calls.  - refill request can take up to 3-5 working days to complete.  - If request is sent electronically and request is appropiate, it is usually completed in 1-2 business days.  - all patients will need to be seen routinely for all chronic medical conditions requiring prescription medications (see follow-up below). If you are overdue for follow up on your condition, you will be asked to make an appointment and we will call in enough medication to cover you until your appointment (up to 30 days).  - all controlled substances will require a face to face visit to request/refill.  - if you desire your prescriptions to go through a new pharmacy, and have an active script at original pharmacy, you will need to call your pharmacy and have scripts transferred to new pharmacy. This is completed between the pharmacy locations and not by your provider.    Results: If any images or labs were ordered, it can take up to 1 week to get results depending on the test ordered and the lab/facility running and resulting the test. - Normal or stable results, which do not need further discussion, may be released to your mychart  immediately with attached note to you. A call may not be generated for normal results. Please make certain to sign up for mychart. If you have questions on how to activate your mychart you can call the front office.  - If your results need further discussion, our office will attempt to contact you via phone, and if unable to reach you after 2 attempts, we will release your abnormal result to your mychart with instructions.  - All results will be automatically released in mychart after 1 week.  - Your provider will provide you with explanation and instruction on all relevant material in your results. Please keep in mind, results and labs may appear confusing or abnormal to the untrained eye, but it does not mean they are actually abnormal for you personally. If you have any questions about your results that are not covered, or you desire more detailed explanation than what was provided, you should make an appointment with your provider to do so.   Our office handles many outgoing and incoming calls daily. If we have not contacted you within 1 week about your results, please check your mychart to see if there is a message first and if not, then contact our office.  In helping with this matter, you help decrease call volume, and therefore allow [place] to be able to respond to patients needs more efficiently.   Acute office visits (sick visit):  An acute visit is intended for a new problem and are scheduled  in shorter time slots to allow schedule openings for patients with new problems. This is the appropriate visit to discuss a new problem. Problems will not be addressed by phone call or Echart message. Appointment is needed if requesting treatment. In order to provide you with excellent quality medical care with proper time for you to explain your problem, have an exam and receive treatment with instructions, these appointments should be limited to one new problem per visit. If you experience a new problem, in  which you desire to be addressed, please make an acute office visit, we save openings on the schedule to accommodate you. Please do not save your new problem for any other type of visit, let us take care of it properly and quickly for you.   Follow up visits:  Depending on your condition(s) your provider will need to see you routinely in order to provide you with quality care and prescribe medication(s). Most chronic conditions (Example: hypertension, Diabetes, depression/anxiety... etc), require visits a couple times a year. Your provider will instruct you on proper follow up for your personal medical conditions and history. Please make certain to make follow up appointments for your condition as instructed. Failing to do so could result in lapse in your medication treatment/refills. If you request a refill, and are overdue to be seen on a condition, we will always provide you with a 30 day script (once) to allow you time to schedule.    Medicare wellness (well visit): - we have a wonderful Nurse Maudie Mercury), that will meet with you and provide you will yearly medicare wellness visits. These visits should occur yearly (can not be scheduled less than 1 calendar year apart) and cover preventive health, immunizations, advance directives and screenings you are entitled to yearly through your medicare benefits. Do not miss out on your entitled benefits, this is when medicare will pay for these benefits to be ordered for you.  These are strongly encouraged by your provider and is the appropriate type of visit to make certain you are up to date with all preventive health benefits. If you have not had your medicare wellness exam in the last 12 months, please make certain to schedule one by calling the office and schedule your medicare wellness with Maudie Mercury as soon as possible.   Yearly physical (well visit):  - Adults are recommended to be seen yearly for physicals. Check with your insurance and date of your last physical,  most insurances require one calendar year between physicals. Physicals include all preventive health topics, screenings, medical exam and labs that are appropriate for gender/age and history. You may have fasting labs needed at this visit. This is a well visit (not a sick visit), new problems should not be covered during this visit (see acute visit).  - Pediatric patients are seen more frequently when they are younger. Your provider will advise you on well child visit timing that is appropriate for your their age. - This is not a medicare wellness visit. Medicare wellness exams do not have an exam portion to the visit. Some medicare companies allow for a physical, some do not allow a yearly physical. If your medicare allows a yearly physical you can schedule the medicare wellness with our nurse Maudie Mercury and have your physical with your provider after, on the same day. Please check with insurance for your full benefits.   Late Policy/No Shows:  - all new patients should arrive 15-30 minutes earlier than appointment to allow [place] time  to  obtain all personal demographics,  insurance information and for you to complete office paperwork. - All established patients should arrive 10-15 minutes earlier than appointment time to update all information and be checked in .  - In our best efforts to run on time, if you are late for your appointment you will be asked to either reschedule or if able, we will work you back into the schedule. There will be a wait time to work you back in the schedule,  depending on availability.  - If you are unable to make it to your appointment as scheduled, please call 24 hours ahead of time to allow [place] to fill the time slot with someone else who needs to be seen. If you do not cancel your appointment ahead of time, you may be charged a no show fee.

## 2018-08-10 NOTE — Telephone Encounter (Signed)
Left message for patient to call and schedule lab appt within next week. CRM sent to [facility] to schedule patient.

## 2018-08-10 NOTE — Telephone Encounter (Signed)
Please call Brian Murray and have him complete a UDS (ordered) within the next week at his convenience. We did not collect before left.

## 2018-08-11 ENCOUNTER — Other Ambulatory Visit (INDEPENDENT_AMBULATORY_CARE_PROVIDER_SITE_OTHER): Payer: PRIVATE HEALTH INSURANCE

## 2018-08-11 DIAGNOSIS — F1190 Opioid use, unspecified, uncomplicated: Secondary | ICD-10-CM

## 2018-08-17 LAB — PAIN MGMT, PROFILE 8 W/CONF, U
6 Acetylmorphine: NEGATIVE ng/mL (ref <10)
Alcohol Metabolites: NEGATIVE ng/mL (ref <500)
Amphetamines: NEGATIVE ng/mL (ref <500)
Benzodiazepines: NEGATIVE ng/mL (ref <100)
Buprenorphine, Urine: NEGATIVE ng/mL (ref <5)
Cocaine Metabolite: NEGATIVE ng/mL (ref <150)
Codeine: NEGATIVE ng/mL (ref <50)
Creatinine: 100.5 mg/dL
Hydrocodone: 819 ng/mL — ABNORMAL HIGH (ref <50)
Hydromorphone: 93 ng/mL — ABNORMAL HIGH (ref <50)
MDMA: NEGATIVE ng/mL (ref <500)
Marijuana Metabolite: NEGATIVE ng/mL (ref <20)
Morphine: NEGATIVE ng/mL (ref <50)
Norhydrocodone: 983 ng/mL — ABNORMAL HIGH (ref <50)
Opiates: POSITIVE ng/mL — AB (ref <100)
Oxidant: NEGATIVE ug/mL (ref <200)
Oxycodone: NEGATIVE ng/mL (ref <100)
pH: 5.9 (ref 4.5–9.0)

## 2018-09-18 DIAGNOSIS — R748 Abnormal levels of other serum enzymes: Secondary | ICD-10-CM

## 2018-09-18 HISTORY — DX: Abnormal levels of other serum enzymes: R74.8

## 2018-10-06 ENCOUNTER — Ambulatory Visit: Payer: PRIVATE HEALTH INSURANCE | Admitting: Family Medicine

## 2018-10-06 ENCOUNTER — Ambulatory Visit (INDEPENDENT_AMBULATORY_CARE_PROVIDER_SITE_OTHER): Payer: PRIVATE HEALTH INSURANCE | Admitting: Family Medicine

## 2018-10-06 ENCOUNTER — Encounter: Payer: Self-pay | Admitting: Family Medicine

## 2018-10-06 VITALS — BP 134/72 | HR 82 | Temp 98.5°F | Resp 16 | Ht 69.0 in | Wt 190.0 lb

## 2018-10-06 DIAGNOSIS — Z79899 Other long term (current) drug therapy: Secondary | ICD-10-CM

## 2018-10-06 DIAGNOSIS — Z125 Encounter for screening for malignant neoplasm of prostate: Secondary | ICD-10-CM | POA: Diagnosis not present

## 2018-10-06 DIAGNOSIS — Z0000 Encounter for general adult medical examination without abnormal findings: Secondary | ICD-10-CM

## 2018-10-06 DIAGNOSIS — I10 Essential (primary) hypertension: Secondary | ICD-10-CM

## 2018-10-06 DIAGNOSIS — Z131 Encounter for screening for diabetes mellitus: Secondary | ICD-10-CM

## 2018-10-06 DIAGNOSIS — E785 Hyperlipidemia, unspecified: Secondary | ICD-10-CM | POA: Diagnosis not present

## 2018-10-06 DIAGNOSIS — R2231 Localized swelling, mass and lump, right upper limb: Secondary | ICD-10-CM

## 2018-10-06 DIAGNOSIS — H6121 Impacted cerumen, right ear: Secondary | ICD-10-CM

## 2018-10-06 LAB — CBC WITH DIFFERENTIAL/PLATELET
Basophils Absolute: 0 10*3/uL (ref 0.0–0.1)
Basophils Relative: 0.9 % (ref 0.0–3.0)
Eosinophils Absolute: 0.4 10*3/uL (ref 0.0–0.7)
Eosinophils Relative: 7.9 % — ABNORMAL HIGH (ref 0.0–5.0)
HCT: 44.1 % (ref 39.0–52.0)
Hemoglobin: 14.6 g/dL (ref 13.0–17.0)
Lymphocytes Relative: 31.6 % (ref 12.0–46.0)
Lymphs Abs: 1.4 10*3/uL (ref 0.7–4.0)
MCHC: 33.2 g/dL (ref 30.0–36.0)
MCV: 96 fl (ref 78.0–100.0)
Monocytes Absolute: 0.7 10*3/uL (ref 0.1–1.0)
Monocytes Relative: 14.9 % — ABNORMAL HIGH (ref 3.0–12.0)
Neutro Abs: 2 10*3/uL (ref 1.4–7.7)
Neutrophils Relative %: 44.7 % (ref 43.0–77.0)
Platelets: 137 10*3/uL — ABNORMAL LOW (ref 150.0–400.0)
RBC: 4.59 Mil/uL (ref 4.22–5.81)
RDW: 13.9 % (ref 11.5–15.5)
WBC: 4.5 10*3/uL (ref 4.0–10.5)

## 2018-10-06 LAB — COMPREHENSIVE METABOLIC PANEL
ALT: 49 U/L (ref 0–53)
AST: 61 U/L — ABNORMAL HIGH (ref 0–37)
Albumin: 4.5 g/dL (ref 3.5–5.2)
Alkaline Phosphatase: 59 U/L (ref 39–117)
BUN: 7 mg/dL (ref 6–23)
CO2: 29 mEq/L (ref 19–32)
Calcium: 9.6 mg/dL (ref 8.4–10.5)
Chloride: 103 mEq/L (ref 96–112)
Creatinine, Ser: 0.8 mg/dL (ref 0.40–1.50)
GFR: 103.12 mL/min (ref >60.00)
Glucose, Bld: 104 mg/dL — ABNORMAL HIGH (ref 70–99)
Potassium: 5.2 mEq/L — ABNORMAL HIGH (ref 3.5–5.1)
Sodium: 140 mEq/L (ref 135–145)
Total Bilirubin: 2 mg/dL — ABNORMAL HIGH (ref 0.2–1.2)
Total Protein: 6.8 g/dL (ref 6.0–8.3)

## 2018-10-06 LAB — LIPID PANEL
Cholesterol: 142 mg/dL (ref 0–200)
HDL: 67.5 mg/dL (ref >39.00)
LDL Cholesterol: 59 mg/dL (ref 0–99)
NonHDL: 74.49
Total CHOL/HDL Ratio: 2
Triglycerides: 78 mg/dL (ref 0.0–149.0)
VLDL: 15.6 mg/dL (ref 0.0–40.0)

## 2018-10-06 LAB — PSA: PSA: 0.49 ng/mL (ref 0.10–4.00)

## 2018-10-06 LAB — HEMOGLOBIN A1C: Hgb A1c MFr Bld: 5.3 % (ref 4.6–6.5)

## 2018-10-06 LAB — TSH: TSH: 1.64 u[IU]/mL (ref 0.35–4.50)

## 2018-10-06 MED ORDER — AMLODIPINE BESYLATE 2.5 MG PO TABS: 2.5000 mg | ORAL_TABLET | Freq: Every day | ORAL | 1 refills | Status: DC
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
## 2018-10-06 NOTE — Patient Instructions (Addendum)
Continue routine visits as scheduled and yearly CPE Added amlodipine 2.5 mg a day to your BP regimen.    Health Maintenance, Male A healthy lifestyle and preventive care is important for your health and wellness. Ask your health care provider about what schedule of regular examinations is right for you. What should I know about weight and diet? Eat a Healthy Diet  Eat plenty of vegetables, fruits, whole grains, low-fat dairy products, and lean protein.  Do not eat a lot of foods high in solid fats, added sugars, or salt.  Maintain a Healthy Weight Regular exercise can help you achieve or maintain a healthy weight. You should:  Do at least 150 minutes of exercise each week. The exercise should increase your heart rate and make you sweat (moderate-intensity exercise).  Do strength-training exercises at least twice a week. Watch Your Levels of Cholesterol and Blood Lipids  Have your blood tested for lipids and cholesterol every 5 years starting at 65 years of age. If you are at high risk for heart disease, you should start having your blood tested when you are 65 years old. You may need to have your cholesterol levels checked more often if: ? Your lipid or cholesterol levels are high. ? You are older than 65 years of age. ? You are at high risk for heart disease. What should I know about cancer screening? Many types of cancers can be detected early and may often be prevented. Lung Cancer  You should be screened every year for lung cancer if: ? You are a current smoker who has smoked for at least 30 years. ? You are a former smoker who has quit within the past 15 years.  Talk to your health care provider about your screening options, when you should start screening, and how often you should be screened. Colorectal Cancer  Routine colorectal cancer screening usually begins at 65 years of age and should be repeated every 5-10 years until you are 65 years old. You may need to be screened  more often if early forms of precancerous polyps or small growths are found. Your health care provider may recommend screening at an earlier age if you have risk factors for colon cancer.  Your health care provider may recommend using home test kits to check for hidden blood in the stool.  A small camera at the end of a tube can be used to examine your colon (sigmoidoscopy or colonoscopy). This checks for the earliest forms of colorectal cancer. Prostate and Testicular Cancer  Depending on your age and overall health, your health care provider may do certain tests to screen for prostate and testicular cancer.  Talk to your health care provider about any symptoms or concerns you have about testicular or prostate cancer. Skin Cancer  Check your skin from head to toe regularly.  Tell your health care provider about any new moles or changes in moles, especially if: ? There is a change in a mole's size, shape, or color. ? You have a mole that is larger than a pencil eraser.  Always use sunscreen. Apply sunscreen liberally and repeat throughout the day.  Protect yourself by wearing long sleeves, pants, a wide-brimmed hat, and sunglasses when outside. What should I know about heart disease, diabetes, and high blood pressure?  If you are 4918-65 years of age, have your blood pressure checked every 3-5 years. If you are 65 years of age or older, have your blood pressure checked every year. You should have your  blood pressure measured twice-once when you are at a hospital or clinic, and once when you are not at a hospital or clinic. Record the average of the two measurements. To check your blood pressure when you are not at a hospital or clinic, you can use: ? An automated blood pressure machine at a pharmacy. ? A home blood pressure monitor.  Talk to your health care provider about your target blood pressure.  If you are between 45-79 years old, ask your health care provider if you should take  aspirin to prevent heart disease.  Have regular diabetes screenings by checking your fasting blood sugar level. ? If you are at a normal weight and have a low risk for diabetes, have this test once every three years after the age of 45. ? If you are overweight and have a high risk for diabetes, consider being tested at a younger age or more often.  A one-time screening for abdominal aortic aneurysm (AAA) by ultrasound is recommended for men aged 65-75 years who are current or former smokers. What should I know about preventing infection? Hepatitis B If you have a higher risk for hepatitis B, you should be screened for this virus. Talk with your health care provider to find out if you are at risk for hepatitis B infection. Hepatitis C Blood testing is recommended for:  Everyone born from 1945 through 1965.  Anyone with known risk factors for hepatitis C. Sexually Transmitted Diseases (STDs)  You should be screened each year for STDs including gonorrhea and chlamydia if: ? You are sexually active and are younger than 65 years of age. ? You are older than 65 years of age and your health care provider tells you that you are at risk for this type of infection. ? Your sexual activity has changed since you were last screened and you are at an increased risk for chlamydia or gonorrhea. Ask your health care provider if you are at risk.  Talk with your health care provider about whether you are at high risk of being infected with HIV. Your health care provider may recommend a prescription medicine to help prevent HIV infection. What else can I do?  Schedule regular health, dental, and eye exams.  Stay current with your vaccines (immunizations).  Do not use any tobacco products, such as cigarettes, chewing tobacco, and e-cigarettes. If you need help quitting, ask your health care provider.  Limit alcohol intake to no more than 2 drinks per day. One drink equals 12 ounces of beer, 5 ounces of  wine, or 1 ounces of hard liquor.  Do not use street drugs.  Do not share needles.  Ask your health care provider for help if you need support or information about quitting drugs.  Tell your health care provider if you often feel depressed.  Tell your health care provider if you have ever been abused or do not feel safe at home. This information is not intended to replace advice given to you by your health care provider. Make sure you discuss any questions you have with your health care provider. Document Released: 04/02/2008 Document Revised: 06/03/2016 Document Reviewed: 07/09/2015 Elsevier Interactive Patient Education  2019 [employer]  

## 2018-10-10 ENCOUNTER — Telehealth: Payer: Self-pay | Admitting: Family Medicine

## 2018-10-10 DIAGNOSIS — M545 Low back pain, unspecified: Secondary | ICD-10-CM

## 2018-10-10 DIAGNOSIS — M542 Cervicalgia: Secondary | ICD-10-CM

## 2018-10-10 DIAGNOSIS — R17 Unspecified jaundice: Secondary | ICD-10-CM

## 2018-10-10 DIAGNOSIS — I10 Essential (primary) hypertension: Secondary | ICD-10-CM

## 2018-10-10 DIAGNOSIS — G8928 Other chronic postprocedural pain: Secondary | ICD-10-CM

## 2018-10-10 DIAGNOSIS — G8929 Other chronic pain: Secondary | ICD-10-CM

## 2018-10-10 DIAGNOSIS — G4700 Insomnia, unspecified: Secondary | ICD-10-CM

## 2018-10-10 DIAGNOSIS — E785 Hyperlipidemia, unspecified: Secondary | ICD-10-CM

## 2018-10-10 DIAGNOSIS — M5010 Cervical disc disorder with radiculopathy, unspecified cervical region: Secondary | ICD-10-CM

## 2018-10-10 MED ORDER — AMLODIPINE BESY-BENAZEPRIL HCL 5-10 MG PO CAPS: 1.0000 | ORAL_CAPSULE | Freq: Every day | ORAL | 1 refills | Status: DC

## 2018-10-10 MED ORDER — GABAPENTIN 400 MG PO CAPS: ORAL_CAPSULE | ORAL | 1 refills | Status: DC

## 2018-10-10 MED ORDER — LOVASTATIN 40 MG PO TABS: 40.0000 mg | ORAL_TABLET | Freq: Every day | ORAL | 3 refills | Status: DC

## 2018-10-10 MED ORDER — NORTRIPTYLINE HCL 75 MG PO CAPS: 150.0000 mg | ORAL_CAPSULE | Freq: Every day | ORAL | 1 refills | Status: DC

## 2018-10-10 MED ORDER — LOVASTATIN 40 MG PO TABS: 80.0000 mg | ORAL_TABLET | Freq: Every day | ORAL | 0 refills | Status: DC

## 2018-10-10 NOTE — Telephone Encounter (Signed)
Please inform patient the following information: His labs all look good, except he does have mildly elevated bilirubin- which we do see with people who are fasting.  Please schedule him a lab appt only in 2 weeks- make sure he does eat prior (NOT fasting) to double check.

## 2018-10-10 NOTE — Telephone Encounter (Signed)
Pt notified and he has an appt in 3 wks.

## 2018-11-04 ENCOUNTER — Ambulatory Visit: Payer: PRIVATE HEALTH INSURANCE | Admitting: Family Medicine

## 2018-11-08 ENCOUNTER — Ambulatory Visit: Payer: PRIVATE HEALTH INSURANCE | Admitting: Family Medicine

## 2018-11-10 ENCOUNTER — Telehealth: Payer: Self-pay | Admitting: Family Medicine

## 2018-11-10 ENCOUNTER — Encounter: Payer: Self-pay | Admitting: Family Medicine

## 2018-11-10 ENCOUNTER — Ambulatory Visit: Payer: PRIVATE HEALTH INSURANCE | Admitting: Family Medicine

## 2018-11-10 VITALS — BP 148/82 | HR 93 | Temp 98.8°F | Resp 16 | Ht 69.0 in | Wt 189.4 lb

## 2018-11-10 DIAGNOSIS — G8928 Other chronic postprocedural pain: Secondary | ICD-10-CM | POA: Insufficient documentation

## 2018-11-10 DIAGNOSIS — F1190 Opioid use, unspecified, uncomplicated: Secondary | ICD-10-CM

## 2018-11-10 DIAGNOSIS — I10 Essential (primary) hypertension: Secondary | ICD-10-CM | POA: Diagnosis not present

## 2018-11-10 DIAGNOSIS — R17 Unspecified jaundice: Secondary | ICD-10-CM

## 2018-11-10 DIAGNOSIS — R7401 Elevation of levels of liver transaminase levels: Secondary | ICD-10-CM | POA: Insufficient documentation

## 2018-11-10 DIAGNOSIS — M5010 Cervical disc disorder with radiculopathy, unspecified cervical region: Secondary | ICD-10-CM

## 2018-11-10 DIAGNOSIS — Z79899 Other long term (current) drug therapy: Secondary | ICD-10-CM

## 2018-11-10 DIAGNOSIS — E785 Hyperlipidemia, unspecified: Secondary | ICD-10-CM | POA: Diagnosis not present

## 2018-11-10 DIAGNOSIS — R740 Nonspecific elevation of levels of transaminase and lactic acid dehydrogenase [LDH]: Secondary | ICD-10-CM | POA: Diagnosis not present

## 2018-11-10 DIAGNOSIS — E663 Overweight: Secondary | ICD-10-CM

## 2018-11-10 DIAGNOSIS — G4700 Insomnia, unspecified: Secondary | ICD-10-CM

## 2018-11-10 DIAGNOSIS — M542 Cervicalgia: Secondary | ICD-10-CM

## 2018-11-10 DIAGNOSIS — T50905A Adverse effect of unspecified drugs, medicaments and biological substances, initial encounter: Secondary | ICD-10-CM

## 2018-11-10 DIAGNOSIS — Z8781 Personal history of (healed) traumatic fracture: Secondary | ICD-10-CM

## 2018-11-10 DIAGNOSIS — M1990 Unspecified osteoarthritis, unspecified site: Secondary | ICD-10-CM

## 2018-11-10 DIAGNOSIS — G8929 Other chronic pain: Secondary | ICD-10-CM

## 2018-11-10 DIAGNOSIS — M545 Low back pain, unspecified: Secondary | ICD-10-CM

## 2018-11-10 HISTORY — DX: Unspecified jaundice: R17

## 2018-11-10 HISTORY — DX: Adverse effect of unspecified drugs, medicaments and biological substances, initial encounter: T50.905A

## 2018-11-10 HISTORY — DX: Elevation of levels of liver transaminase levels: R74.01

## 2018-11-10 LAB — COMPREHENSIVE METABOLIC PANEL
ALT: 22 U/L (ref 0–53)
AST: 26 U/L (ref 0–37)
Albumin: 4.9 g/dL (ref 3.5–5.2)
Alkaline Phosphatase: 53 U/L (ref 39–117)
BUN: 12 mg/dL (ref 6–23)
CO2: 28 mEq/L (ref 19–32)
Calcium: 10.3 mg/dL (ref 8.4–10.5)
Chloride: 102 mEq/L (ref 96–112)
Creatinine, Ser: 0.83 mg/dL (ref 0.40–1.50)
GFR: 92.96 mL/min (ref >60.00)
Glucose, Bld: 106 mg/dL — ABNORMAL HIGH (ref 70–99)
Potassium: 5 mEq/L (ref 3.5–5.1)
Sodium: 139 mEq/L (ref 135–145)
Total Bilirubin: 0.8 mg/dL (ref 0.2–1.2)
Total Protein: 7.4 g/dL (ref 6.0–8.3)

## 2018-11-10 MED ORDER — LOVASTATIN 40 MG PO TABS: 80.0000 mg | ORAL_TABLET | Freq: Every day | ORAL | 0 refills | Status: DC

## 2018-11-10 MED ORDER — AMLODIPINE BESY-BENAZEPRIL HCL 5-20 MG PO CAPS: 1.0000 | ORAL_CAPSULE | Freq: Every day | ORAL | 1 refills | Status: DC

## 2018-11-10 MED ORDER — GABAPENTIN 400 MG PO CAPS: ORAL_CAPSULE | ORAL | 1 refills | Status: DC

## 2018-11-10 MED ORDER — CYCLOBENZAPRINE HCL 10 MG PO TABS: 10.0000 mg | ORAL_TABLET | Freq: Three times a day (TID) | ORAL | 3 refills | Status: DC | PRN

## 2018-11-10 MED ORDER — HYDROCODONE-ACETAMINOPHEN 10-325 MG PO TABS: 1.0000 | ORAL_TABLET | Freq: Three times a day (TID) | ORAL | 0 refills | Status: DC | PRN
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
## 2018-11-10 NOTE — Patient Instructions (Signed)
Increase amlodipine by taking a total of 5 mg extra (2 of the 2.5 mg tabs) and continue the combo pill.   Stop Kratom. It is likely causing your hallucinations. If they continue after stopping for 2-4 weeks, then be seen again for those.   F/u 3 mos  Please help [place] help you:  We are honored you have chosen [facility] for your Primary Care home. Below you will find basic instructions that you may need to access in the future. Please help [place] help you by reading the instructions, which cover many of the frequent questions we experience.   Prescription refills and request:  -In order to allow more efficient response time, please call your pharmacy for all refills. They will forward the request electronically to [place]. This allows for the quickest possible response. Request left on a nurse line can take longer to refill, since these are checked as time allows between office patients and other phone calls.  - refill request can take up to 3-5 working days to complete.  - If request is sent electronically and request is appropiate, it is usually completed in 1-2 business days.  - all patients will need to be seen routinely for all chronic medical conditions requiring prescription medications (see follow-up below). If you are overdue for follow up on your condition, you will be asked to make an appointment and we will call in enough medication to cover you until your appointment (up to 30 days).  - all controlled substances will require a face to face visit to request/refill.  - if you desire your prescriptions to go through a new pharmacy, and have an active script at original pharmacy, you will need to call your pharmacy and have scripts transferred to new pharmacy. This is completed between the pharmacy locations and not by your provider.    Results: If any images or labs were ordered, it can take up to 1 week to get results depending on the test ordered and the lab/facility running and resulting  the test. - Normal or stable results, which do not need further discussion, may be released to your mychart immediately with attached note to you. A call may not be generated for normal results. Please make certain to sign up for mychart. If you have questions on how to activate your mychart you can call the front office.  - If your results need further discussion, our office will attempt to contact you via phone, and if unable to reach you after 2 attempts, we will release your abnormal result to your mychart with instructions.  - All results will be automatically released in mychart after 1 week.  - Your provider will provide you with explanation and instruction on all relevant material in your results. Please keep in mind, results and labs may appear confusing or abnormal to the untrained eye, but it does not mean they are actually abnormal for you personally. If you have any questions about your results that are not covered, or you desire more detailed explanation than what was provided, you should make an appointment with your provider to do so.   Our office handles many outgoing and incoming calls daily. If we have not contacted you within 1 week about your results, please check your mychart to see if there is a message first and if not, then contact our office.  In helping with this matter, you help decrease call volume, and therefore allow [place] to be able to respond to patients needs  more efficiently.   Acute office visits (sick visit):  An acute visit is intended for a new problem and are scheduled in shorter time slots to allow schedule openings for patients with new problems. This is the appropriate visit to discuss a new problem. Problems will not be addressed by phone call or Echart message. Appointment is needed if requesting treatment. In order to provide you with excellent quality medical care with proper time for you to explain your problem, have an exam and receive treatment with  instructions, these appointments should be limited to one new problem per visit. If you experience a new problem, in which you desire to be addressed, please make an acute office visit, we save openings on the schedule to accommodate you. Please do not save your new problem for any other type of visit, let us take care of it properly and quickly for you.   Follow up visits:  Depending on your condition(s) your provider will need to see you routinely in order to provide you with quality care and prescribe medication(s). Most chronic conditions (Example: hypertension, Diabetes, depression/anxiety... etc), require visits a couple times a year. Your provider will instruct you on proper follow up for your personal medical conditions and history. Please make certain to make follow up appointments for your condition as instructed. Failing to do so could result in lapse in your medication treatment/refills. If you request a refill, and are overdue to be seen on a condition, we will always provide you with a 30 day script (once) to allow you time to schedule.    Medicare wellness (well visit): - we have a wonderful Nurse Maudie Mercury), that will meet with you and provide you will yearly medicare wellness visits. These visits should occur yearly (can not be scheduled less than 1 calendar year apart) and cover preventive health, immunizations, advance directives and screenings you are entitled to yearly through your medicare benefits. Do not miss out on your entitled benefits, this is when medicare will pay for these benefits to be ordered for you.  These are strongly encouraged by your provider and is the appropriate type of visit to make certain you are up to date with all preventive health benefits. If you have not had your medicare wellness exam in the last 12 months, please make certain to schedule one by calling the office and schedule your medicare wellness with Maudie Mercury as soon as possible.   Yearly physical (well visit):   - Adults are recommended to be seen yearly for physicals. Check with your insurance and date of your last physical, most insurances require one calendar year between physicals. Physicals include all preventive health topics, screenings, medical exam and labs that are appropriate for gender/age and history. You may have fasting labs needed at this visit. This is a well visit (not a sick visit), new problems should not be covered during this visit (see acute visit).  - Pediatric patients are seen more frequently when they are younger. Your provider will advise you on well child visit timing that is appropriate for your their age. - This is not a medicare wellness visit. Medicare wellness exams do not have an exam portion to the visit. Some medicare companies allow for a physical, some do not allow a yearly physical. If your medicare allows a yearly physical you can schedule the medicare wellness with our nurse Maudie Mercury and have your physical with your provider after, on the same day. Please check with insurance for your full benefits.  Late Policy/No Shows:  - all new patients should arrive 15-30 minutes earlier than appointment to allow [place] time  to  obtain all personal demographics,  insurance information and for you to complete office paperwork. - All established patients should arrive 10-15 minutes earlier than appointment time to update all information and be checked in .  - In our best efforts to run on time, if you are late for your appointment you will be asked to either reschedule or if able, we will work you back into the schedule. There will be a wait time to work you back in the schedule,  depending on availability.  - If you are unable to make it to your appointment as scheduled, please call 24 hours ahead of time to allow [place] to fill the time slot with someone else who needs to be seen. If you do not cancel your appointment ahead of time, you may be charged a no show fee.

## 2018-11-10 NOTE — Telephone Encounter (Signed)
Spoke with pt and instructions given. Pt voiced understand and will start taking medication as rxed.

## 2018-11-10 NOTE — Telephone Encounter (Signed)
Please inform patient the following information: For simplicity after pt left decided to increase the benazepril portion of his BP med (instead of amlodipine portion).  Please tell him he can finish the bottle he has by taking the one tab of combo med with the 2 tabs amlodipine (2.5 mg). However- when he gets his new combo bottle only take the one tab of the combo pill- and the new dose will be amlodipine 5 mg and benazepril 20 mg.  Again, Once he starts new dose/bottle--> do not take extra amlodipine.

## 2018-11-11 ENCOUNTER — Telehealth: Payer: Self-pay | Admitting: Family Medicine

## 2018-11-11 LAB — BILIRUBIN, FRACTIONATED(TOT/DIR/INDIR)
Bilirubin, Direct: 0.2 mg/dL (ref 0.0–0.2)
Indirect Bilirubin: 0.6 mg/dL (calc) (ref 0.2–1.2)
Total Bilirubin: 0.8 mg/dL (ref 0.2–1.2)

## 2018-11-11 NOTE — Telephone Encounter (Unsigned)
Copied from CRM [id number]. Topic: Quick Communication - Lab Results (Clinic Use ONLY) >> Nov 11, 2018 10:26 AM Daryel November, LPN wrote: Pt called and message left to return call to get lab results   Okay for [facility] to discuss results/PCP recommendations.

## 2018-11-11 NOTE — Telephone Encounter (Signed)
Patient notified of results.

## 2018-11-15 ENCOUNTER — Telehealth: Payer: Self-pay | Admitting: Family Medicine

## 2018-11-15 DIAGNOSIS — G8928 Other chronic postprocedural pain: Secondary | ICD-10-CM

## 2018-11-15 DIAGNOSIS — G8929 Other chronic pain: Secondary | ICD-10-CM

## 2018-11-15 DIAGNOSIS — M1990 Unspecified osteoarthritis, unspecified site: Secondary | ICD-10-CM

## 2018-11-15 DIAGNOSIS — M545 Low back pain, unspecified: Secondary | ICD-10-CM

## 2018-11-15 DIAGNOSIS — M5010 Cervical disc disorder with radiculopathy, unspecified cervical region: Secondary | ICD-10-CM

## 2018-11-15 DIAGNOSIS — F1190 Opioid use, unspecified, uncomplicated: Secondary | ICD-10-CM

## 2018-11-15 MED ORDER — HYDROCODONE-ACETAMINOPHEN 10-325 MG PO TABS: 1.0000 | ORAL_TABLET | Freq: Three times a day (TID) | ORAL | 0 refills | Status: DC | PRN

## 2018-11-15 NOTE — Telephone Encounter (Signed)
Copied from CRM [id number]. Topic: Quick Communication - See Telephone Encounter >> Nov 15, 2018  9:45 AM Lorrine Kin, NT wrote: CRM for notification. See Telephone encounter for: 11/15/18. Patient calling and states that the pharmacy does not have the HYDROcodone-acetaminophen (NORCO) 10-325 MG tablet in stock. Patient would like to know if the medication could be sent to [id number] - [place], [place] - [address] [place] [address] [place] [address]

## 2018-11-15 NOTE — Telephone Encounter (Signed)
Patient advised that RX was sent to [facility] [place].

## 2018-11-15 NOTE — Telephone Encounter (Signed)
Since he was seen face to face for this script, [facility] database was reviewed at that visit we will replace script.  - original script is cancelled to [facility]

## 2018-11-15 NOTE — Telephone Encounter (Signed)
They can transfer the [facility]- so there is only one active script and it is used.

## 2018-11-15 NOTE — Telephone Encounter (Signed)
Contacted [facility].  They do not have the medication, there is a Geneticist, molecular.   I contacted [facility] [place] and they do have the medication in stock.  Please advise.

## 2018-11-15 NOTE — Telephone Encounter (Signed)
The pharmacy told me they can not transfer because it was controlled.  I did cancel RX that was sent to [facility].

## 2019-02-02 ENCOUNTER — Other Ambulatory Visit: Payer: Self-pay

## 2019-02-02 ENCOUNTER — Ambulatory Visit (INDEPENDENT_AMBULATORY_CARE_PROVIDER_SITE_OTHER): Payer: PRIVATE HEALTH INSURANCE | Admitting: Family Medicine

## 2019-02-02 ENCOUNTER — Encounter: Payer: Self-pay | Admitting: Family Medicine

## 2019-02-02 VITALS — BP 133/81 | HR 92 | Ht 69.0 in | Wt 189.0 lb

## 2019-02-02 DIAGNOSIS — G4700 Insomnia, unspecified: Secondary | ICD-10-CM

## 2019-02-02 DIAGNOSIS — E785 Hyperlipidemia, unspecified: Secondary | ICD-10-CM

## 2019-02-02 DIAGNOSIS — Z8781 Personal history of (healed) traumatic fracture: Secondary | ICD-10-CM

## 2019-02-02 DIAGNOSIS — I10 Essential (primary) hypertension: Secondary | ICD-10-CM

## 2019-02-02 DIAGNOSIS — F1190 Opioid use, unspecified, uncomplicated: Secondary | ICD-10-CM | POA: Diagnosis not present

## 2019-02-02 DIAGNOSIS — G8928 Other chronic postprocedural pain: Secondary | ICD-10-CM

## 2019-02-02 DIAGNOSIS — G8929 Other chronic pain: Secondary | ICD-10-CM

## 2019-02-02 DIAGNOSIS — M545 Low back pain: Secondary | ICD-10-CM

## 2019-02-02 DIAGNOSIS — M5010 Cervical disc disorder with radiculopathy, unspecified cervical region: Secondary | ICD-10-CM

## 2019-02-02 DIAGNOSIS — M1990 Unspecified osteoarthritis, unspecified site: Secondary | ICD-10-CM

## 2019-02-02 DIAGNOSIS — Z79899 Other long term (current) drug therapy: Secondary | ICD-10-CM

## 2019-02-02 DIAGNOSIS — M542 Cervicalgia: Secondary | ICD-10-CM

## 2019-02-02 MED ORDER — AMLODIPINE BESY-BENAZEPRIL HCL 5-20 MG PO CAPS: 1.0000 | ORAL_CAPSULE | Freq: Every day | ORAL | 1 refills | Status: DC

## 2019-02-02 MED ORDER — NORTRIPTYLINE HCL 75 MG PO CAPS: 150.0000 mg | ORAL_CAPSULE | Freq: Every day | ORAL | 1 refills | Status: DC

## 2019-02-02 MED ORDER — GABAPENTIN 400 MG PO CAPS: ORAL_CAPSULE | ORAL | 1 refills | Status: DC

## 2019-02-02 MED ORDER — HYDROCODONE-ACETAMINOPHEN 10-325 MG PO TABS: 1.0000 | ORAL_TABLET | Freq: Three times a day (TID) | ORAL | 0 refills | Status: DC | PRN

## 2019-02-02 MED ORDER — LOVASTATIN 40 MG PO TABS: 80.0000 mg | ORAL_TABLET | Freq: Every day | ORAL | 3 refills | Status: DC
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
## 2019-05-02 ENCOUNTER — Ambulatory Visit: Payer: PRIVATE HEALTH INSURANCE | Admitting: Family Medicine

## 2019-05-02 DIAGNOSIS — Z0289 Encounter for other administrative examinations: Secondary | ICD-10-CM

## 2019-05-15 ENCOUNTER — Ambulatory Visit (INDEPENDENT_AMBULATORY_CARE_PROVIDER_SITE_OTHER): Payer: PRIVATE HEALTH INSURANCE | Admitting: Family Medicine

## 2019-05-15 ENCOUNTER — Encounter: Payer: Self-pay | Admitting: Family Medicine

## 2019-05-15 ENCOUNTER — Other Ambulatory Visit: Payer: Self-pay

## 2019-05-15 VITALS — BP 149/92 | HR 90 | Temp 100.4°F | Wt 188.0 lb

## 2019-05-15 DIAGNOSIS — R509 Fever, unspecified: Secondary | ICD-10-CM

## 2019-05-15 DIAGNOSIS — G8928 Other chronic postprocedural pain: Secondary | ICD-10-CM

## 2019-05-15 DIAGNOSIS — M5010 Cervical disc disorder with radiculopathy, unspecified cervical region: Secondary | ICD-10-CM

## 2019-05-15 DIAGNOSIS — I10 Essential (primary) hypertension: Secondary | ICD-10-CM | POA: Diagnosis not present

## 2019-05-15 DIAGNOSIS — E785 Hyperlipidemia, unspecified: Secondary | ICD-10-CM

## 2019-05-15 DIAGNOSIS — R945 Abnormal results of liver function studies: Secondary | ICD-10-CM

## 2019-05-15 DIAGNOSIS — M545 Low back pain, unspecified: Secondary | ICD-10-CM

## 2019-05-15 DIAGNOSIS — G4700 Insomnia, unspecified: Secondary | ICD-10-CM

## 2019-05-15 DIAGNOSIS — G8929 Other chronic pain: Secondary | ICD-10-CM

## 2019-05-15 DIAGNOSIS — M542 Cervicalgia: Secondary | ICD-10-CM

## 2019-05-15 DIAGNOSIS — R7989 Other specified abnormal findings of blood chemistry: Secondary | ICD-10-CM

## 2019-05-15 DIAGNOSIS — F1190 Opioid use, unspecified, uncomplicated: Secondary | ICD-10-CM

## 2019-05-15 DIAGNOSIS — M1990 Unspecified osteoarthritis, unspecified site: Secondary | ICD-10-CM

## 2019-05-15 MED ORDER — AMLODIPINE BESY-BENAZEPRIL HCL 5-20 MG PO CAPS: 1.0000 | ORAL_CAPSULE | Freq: Every day | ORAL | 1 refills | Status: DC

## 2019-05-15 MED ORDER — HYDROCODONE-ACETAMINOPHEN 10-325 MG PO TABS: 1.0000 | ORAL_TABLET | Freq: Three times a day (TID) | ORAL | 0 refills | Status: DC | PRN

## 2019-05-15 MED ORDER — AMLODIPINE BESYLATE 5 MG PO TABS: 5.0000 mg | ORAL_TABLET | Freq: Every day | ORAL | 1 refills | Status: DC

## 2019-05-15 MED ORDER — NORTRIPTYLINE HCL 75 MG PO CAPS: 150.0000 mg | ORAL_CAPSULE | Freq: Every day | ORAL | 1 refills | Status: DC

## 2019-05-15 MED ORDER — GABAPENTIN 400 MG PO CAPS: ORAL_CAPSULE | ORAL | 1 refills | Status: DC
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
## 2019-05-19 ENCOUNTER — Encounter: Payer: Self-pay | Admitting: Family Medicine

## 2019-05-19 ENCOUNTER — Ambulatory Visit (INDEPENDENT_AMBULATORY_CARE_PROVIDER_SITE_OTHER): Payer: PRIVATE HEALTH INSURANCE | Admitting: Family Medicine

## 2019-05-19 ENCOUNTER — Other Ambulatory Visit: Payer: Self-pay

## 2019-05-19 DIAGNOSIS — R945 Abnormal results of liver function studies: Secondary | ICD-10-CM

## 2019-05-19 DIAGNOSIS — R7989 Other specified abnormal findings of blood chemistry: Secondary | ICD-10-CM

## 2019-05-19 DIAGNOSIS — R509 Fever, unspecified: Secondary | ICD-10-CM | POA: Diagnosis not present

## 2019-05-19 LAB — COMPREHENSIVE METABOLIC PANEL
ALT: 25 U/L (ref 0–53)
AST: 30 U/L (ref 0–37)
Albumin: 5 g/dL (ref 3.5–5.2)
Alkaline Phosphatase: 65 U/L (ref 39–117)
BUN: 10 mg/dL (ref 6–23)
CO2: 26 mEq/L (ref 19–32)
Calcium: 10 mg/dL (ref 8.4–10.5)
Chloride: 102 mEq/L (ref 96–112)
Creatinine, Ser: 0.79 mg/dL (ref 0.40–1.50)
GFR: 98.25 mL/min (ref >60.00)
Glucose, Bld: 93 mg/dL (ref 70–99)
Potassium: 4.2 mEq/L (ref 3.5–5.1)
Sodium: 138 mEq/L (ref 135–145)
Total Bilirubin: 1.5 mg/dL — ABNORMAL HIGH (ref 0.2–1.2)
Total Protein: 7.5 g/dL (ref 6.0–8.3)

## 2019-05-19 LAB — CBC WITH DIFFERENTIAL/PLATELET
Basophils Absolute: 0.1 10*3/uL (ref 0.0–0.1)
Basophils Relative: 1.2 % (ref 0.0–3.0)
Eosinophils Absolute: 0.4 10*3/uL (ref 0.0–0.7)
Eosinophils Relative: 6.4 % — ABNORMAL HIGH (ref 0.0–5.0)
HCT: 45.1 % (ref 39.0–52.0)
Hemoglobin: 15.1 g/dL (ref 13.0–17.0)
Lymphocytes Relative: 33.4 % (ref 12.0–46.0)
Lymphs Abs: 1.9 10*3/uL (ref 0.7–4.0)
MCHC: 33.5 g/dL (ref 30.0–36.0)
MCV: 94.3 fl (ref 78.0–100.0)
Monocytes Absolute: 0.6 10*3/uL (ref 0.1–1.0)
Monocytes Relative: 10.8 % (ref 3.0–12.0)
Neutro Abs: 2.8 10*3/uL (ref 1.4–7.7)
Neutrophils Relative %: 48.2 % (ref 43.0–77.0)
Platelets: 192 10*3/uL (ref 150.0–400.0)
RBC: 4.79 Mil/uL (ref 4.22–5.81)
RDW: 13.5 % (ref 11.5–15.5)
WBC: 5.7 10*3/uL (ref 4.0–10.5)

## 2019-05-19 LAB — C-REACTIVE PROTEIN: CRP: <1 mg/dL (ref 0.5–20.0)

## 2019-05-19 LAB — SEDIMENTATION RATE: Sed Rate: 3 mm/hr (ref 0–20)

## 2019-05-20 LAB — URINALYSIS W MICROSCOPIC + REFLEX CULTURE
Bacteria, UA: NONE SEEN /HPF
Bilirubin Urine: NEGATIVE
Glucose, UA: NEGATIVE
Hgb urine dipstick: NEGATIVE
Hyaline Cast: NONE SEEN /LPF
Ketones, ur: NEGATIVE
Leukocyte Esterase: NEGATIVE
Nitrites, Initial: NEGATIVE
Protein, ur: NEGATIVE
RBC / HPF: NONE SEEN /HPF (ref 0–2)
Specific Gravity, Urine: 1.008 (ref 1.001–1.03)
Squamous Epithelial / LPF: NONE SEEN /HPF (ref <=5)
WBC, UA: NONE SEEN /HPF (ref 0–5)
pH: 5.5 (ref 5.0–8.0)

## 2019-05-20 LAB — NO CULTURE INDICATED

## 2019-05-22 ENCOUNTER — Telehealth: Payer: Self-pay | Admitting: Family Medicine

## 2019-05-22 MED ORDER — DOXYCYCLINE HYCLATE 100 MG PO TABS: 100.0000 mg | ORAL_TABLET | Freq: Two times a day (BID) | ORAL | 0 refills | Status: DC

## 2019-05-22 NOTE — Telephone Encounter (Signed)
Please inform patient the following information (I attempted to call yesterday but did not get an answer): -I am still waiting on two labs to return.  However his [facility] spotted fever titers have returned extremely elevated. -These particular titers, if ever exposed to [facility] spotted fever stay mildly elevated throughout the course of one's life.  However when titers are as high as his appear, it typically means an acute exposure to RMSF. -Does he have a rash-typically starts at ankles or wrists and works towards torso with RMSF.  Has he noticed a tick exposure recently? -Treatment is doxycycline every 12 hours.  I have called this in for him.   -He is to take the doxycycline every 12 hours for 10 days.  Follow-up on day 10.  Virtual appointment is okay.  The idea is to continue doxycycline daily until he has been fever free for at least 3 days.  Therefore, if needed will extend doxycycline if needed on follow-up.

## 2019-05-23 NOTE — Telephone Encounter (Signed)
Pt was called and given information. Pt states he has no rash and has never had one but during the month of May he had 12 tick bites. Pt was scheduled for a virtual visit and started abx last night. His reports temp being 103.12F last night, which is the highest it has been. He reports being at 99.53F this AM and "actually feeling a little better".

## 2019-05-24 ENCOUNTER — Telehealth: Payer: Self-pay | Admitting: Family Medicine

## 2019-05-24 LAB — EPSTEIN-BARR VIRUS VCA, IGM: EBV VCA IgM: <36 U/mL

## 2019-05-24 LAB — HIV ANTIBODY (ROUTINE TESTING W REFLEX): HIV 1&2 Ab, 4th Generation: NONREACTIVE

## 2019-05-24 LAB — HEPATITIS PANEL, ACUTE
Hep A IgM: NONREACTIVE
Hep B C IgM: NONREACTIVE
Hepatitis B Surface Ag: NONREACTIVE
Hepatitis C Ab: NONREACTIVE
SIGNAL TO CUT-OFF: 0.02 (ref <1.00)

## 2019-05-24 LAB — CMV IGM: CMV IgM: <30 AU/mL

## 2019-05-24 LAB — B. BURGDORFI ANTIBODIES: B burgdorferi Ab IgG+IgM: <0.9 index

## 2019-05-24 LAB — ROCKY MTN SPOTTED FVR ABS PNL(IGG+IGM)
RMSF IgG: DETECTED — AB
RMSF IgM: NOT DETECTED

## 2019-05-24 LAB — REFLEX RMSF IGG TITER: RMSF IgG Titer: 1:256 {titer} — ABNORMAL HIGH

## 2019-05-24 LAB — EPSTEIN-BARR VIRUS VCA, IGG: EBV VCA IgG: 87.4 U/mL — ABNORMAL HIGH

## 2019-05-24 LAB — RSV(RESPIRATORY SYNCYTIAL VIRUS) AB, BLOOD: RSV Antibodies: >=1:64 {titer} — AB

## 2019-05-24 NOTE — Telephone Encounter (Signed)
Please inform patient the following information: The rest of his labs are normal. Continue with follow up next week.

## 2019-05-24 NOTE — Telephone Encounter (Signed)
Patient notified of PCP recommendations and is agreement and expresses an understanding.   Ok for PEC to Discuss results / PCP recommendations / Schedule patient.   

## 2019-05-31 ENCOUNTER — Ambulatory Visit: Payer: PRIVATE HEALTH INSURANCE | Admitting: Family Medicine

## 2019-08-16 ENCOUNTER — Encounter: Payer: Self-pay | Admitting: Family Medicine

## 2019-08-16 ENCOUNTER — Other Ambulatory Visit: Payer: Self-pay

## 2019-08-16 ENCOUNTER — Ambulatory Visit: Payer: PRIVATE HEALTH INSURANCE | Admitting: Family Medicine

## 2019-08-16 VITALS — BP 166/81 | HR 90 | Temp 99.3°F | Resp 16 | Ht 69.0 in | Wt 196.0 lb

## 2019-08-16 DIAGNOSIS — G8929 Other chronic pain: Secondary | ICD-10-CM

## 2019-08-16 DIAGNOSIS — E785 Hyperlipidemia, unspecified: Secondary | ICD-10-CM

## 2019-08-16 DIAGNOSIS — A770 Spotted fever due to Rickettsia rickettsii: Secondary | ICD-10-CM | POA: Diagnosis not present

## 2019-08-16 DIAGNOSIS — R7401 Elevation of levels of liver transaminase levels: Secondary | ICD-10-CM

## 2019-08-16 DIAGNOSIS — M1990 Unspecified osteoarthritis, unspecified site: Secondary | ICD-10-CM

## 2019-08-16 DIAGNOSIS — Z23 Encounter for immunization: Secondary | ICD-10-CM | POA: Diagnosis not present

## 2019-08-16 DIAGNOSIS — M542 Cervicalgia: Secondary | ICD-10-CM

## 2019-08-16 DIAGNOSIS — G4700 Insomnia, unspecified: Secondary | ICD-10-CM

## 2019-08-16 DIAGNOSIS — F1190 Opioid use, unspecified, uncomplicated: Secondary | ICD-10-CM

## 2019-08-16 DIAGNOSIS — M5010 Cervical disc disorder with radiculopathy, unspecified cervical region: Secondary | ICD-10-CM

## 2019-08-16 DIAGNOSIS — M545 Low back pain, unspecified: Secondary | ICD-10-CM

## 2019-08-16 DIAGNOSIS — G8928 Other chronic postprocedural pain: Secondary | ICD-10-CM

## 2019-08-16 LAB — COMPREHENSIVE METABOLIC PANEL
ALT: 24 U/L (ref 0–53)
AST: 30 U/L (ref 0–37)
Albumin: 4.8 g/dL (ref 3.5–5.2)
Alkaline Phosphatase: 55 U/L (ref 39–117)
BUN: 10 mg/dL (ref 6–23)
CO2: 29 mEq/L (ref 19–32)
Calcium: 9.8 mg/dL (ref 8.4–10.5)
Chloride: 102 mEq/L (ref 96–112)
Creatinine, Ser: 0.85 mg/dL (ref 0.40–1.50)
GFR: 90.23 mL/min (ref >60.00)
Glucose, Bld: 126 mg/dL — ABNORMAL HIGH (ref 70–99)
Potassium: 4.4 mEq/L (ref 3.5–5.1)
Sodium: 137 mEq/L (ref 135–145)
Total Bilirubin: 1 mg/dL (ref 0.2–1.2)
Total Protein: 7.1 g/dL (ref 6.0–8.3)

## 2019-08-16 MED ORDER — CYCLOBENZAPRINE HCL 10 MG PO TABS: 10.0000 mg | ORAL_TABLET | Freq: Three times a day (TID) | ORAL | 3 refills | Status: DC | PRN

## 2019-08-16 MED ORDER — AMLODIPINE BESY-BENAZEPRIL HCL 10-40 MG PO CAPS: 1.0000 | ORAL_CAPSULE | Freq: Every day | ORAL | 1 refills | Status: DC

## 2019-08-16 MED ORDER — NORTRIPTYLINE HCL 75 MG PO CAPS: 150.0000 mg | ORAL_CAPSULE | Freq: Every day | ORAL | 1 refills | Status: DC

## 2019-08-16 MED ORDER — HYDROCODONE-ACETAMINOPHEN 10-325 MG PO TABS: 1.0000 | ORAL_TABLET | Freq: Three times a day (TID) | ORAL | 0 refills | Status: DC | PRN

## 2019-08-16 MED ORDER — LOVASTATIN 40 MG PO TABS: 80.0000 mg | ORAL_TABLET | Freq: Every day | ORAL | 3 refills | Status: DC

## 2019-08-16 MED ORDER — GABAPENTIN 400 MG PO CAPS: ORAL_CAPSULE | ORAL | 1 refills | Status: DC
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
## 2019-08-16 NOTE — Patient Instructions (Signed)
We will call you with results of labs once available and discuss further plan on fever. Meds stayed the same except increase the BP med. The new combo med will be higher dose, so only take one and do not take the other two tabs you were taking.

## 2019-08-18 ENCOUNTER — Telehealth: Payer: Self-pay | Admitting: Family Medicine

## 2019-08-18 LAB — ROCKY MTN SPOTTED FVR AB, IGG-BLOOD: RMSF IgG: POSITIVE — AB

## 2019-08-18 LAB — PAIN MGMT, PROFILE 8 W/CONF, U
6 Acetylmorphine: NEGATIVE ng/mL
Alcohol Metabolites: POSITIVE ng/mL — AB (ref <500)
Amphetamines: NEGATIVE ng/mL
Benzodiazepines: NEGATIVE ng/mL
Buprenorphine, Urine: NEGATIVE ng/mL
Cocaine Metabolite: NEGATIVE ng/mL
Codeine: NEGATIVE ng/mL
Creatinine: 40.5 mg/dL
Ethyl Glucuronide (ETG): 10097 ng/mL
Ethyl Sulfate (ETS): 3155 ng/mL
Hydrocodone: 383 ng/mL
Hydromorphone: 50 ng/mL
MDMA: NEGATIVE ng/mL
Marijuana Metabolite: NEGATIVE ng/mL
Morphine: NEGATIVE ng/mL
Norhydrocodone: 300 ng/mL
Opiates: POSITIVE ng/mL
Oxidant: NEGATIVE ug/mL
Oxycodone: NEGATIVE ng/mL
pH: 5.4 (ref 4.5–9.0)

## 2019-08-18 LAB — RMSF, IGG, IFA: RMSF, IGG, IFA: 1:128 {titer} — ABNORMAL HIGH

## 2019-08-18 MED ORDER — DOXYCYCLINE HYCLATE 100 MG PO TABS: 100.0000 mg | ORAL_TABLET | Freq: Two times a day (BID) | ORAL | 0 refills | Status: DC

## 2019-08-18 NOTE — Telephone Encounter (Signed)
Pt was called and detailed message was left on patients VM with results and instructions. He was asked to return call to schedule F/U appt

## 2019-08-18 NOTE — Telephone Encounter (Signed)
Please inform patient the following information: His RMSF titer is lower than last time but still elevated at a recent or active level. Given that he endorses continued low grade fever, I suggest  Restarting doxycycline BID and taking for 21 days. Follow up on in 2 weeks before stopping abx, so we can see if he is still having fever and make a plan from there. If fevers are not resolving then we need to consider infectious disease referral.

## 2019-08-19 ENCOUNTER — Encounter: Payer: Self-pay | Admitting: Family Medicine

## 2019-08-19 DIAGNOSIS — A770 Spotted fever due to Rickettsia rickettsii: Secondary | ICD-10-CM

## 2019-08-19 HISTORY — DX: Spotted fever due to Rickettsia rickettsii: A77.0

## 2019-11-14 ENCOUNTER — Ambulatory Visit: Payer: PRIVATE HEALTH INSURANCE | Admitting: Family Medicine

## 2019-11-14 ENCOUNTER — Ambulatory Visit (INDEPENDENT_AMBULATORY_CARE_PROVIDER_SITE_OTHER): Payer: PRIVATE HEALTH INSURANCE

## 2019-11-14 ENCOUNTER — Other Ambulatory Visit: Payer: Self-pay

## 2019-11-14 ENCOUNTER — Telehealth: Payer: Self-pay | Admitting: Family Medicine

## 2019-11-14 ENCOUNTER — Encounter: Payer: Self-pay | Admitting: Family Medicine

## 2019-11-14 VITALS — BP 144/74 | HR 88 | Temp 99.2°F | Resp 16 | Ht 69.0 in | Wt 206.1 lb

## 2019-11-14 DIAGNOSIS — M542 Cervicalgia: Secondary | ICD-10-CM

## 2019-11-14 DIAGNOSIS — R600 Localized edema: Secondary | ICD-10-CM

## 2019-11-14 DIAGNOSIS — M5010 Cervical disc disorder with radiculopathy, unspecified cervical region: Secondary | ICD-10-CM | POA: Diagnosis not present

## 2019-11-14 DIAGNOSIS — A770 Spotted fever due to Rickettsia rickettsii: Secondary | ICD-10-CM

## 2019-11-14 DIAGNOSIS — M1990 Unspecified osteoarthritis, unspecified site: Secondary | ICD-10-CM

## 2019-11-14 DIAGNOSIS — R509 Fever, unspecified: Secondary | ICD-10-CM | POA: Diagnosis not present

## 2019-11-14 DIAGNOSIS — R5383 Other fatigue: Secondary | ICD-10-CM | POA: Diagnosis not present

## 2019-11-14 DIAGNOSIS — M545 Low back pain: Secondary | ICD-10-CM

## 2019-11-14 DIAGNOSIS — F1190 Opioid use, unspecified, uncomplicated: Secondary | ICD-10-CM

## 2019-11-14 DIAGNOSIS — G4700 Insomnia, unspecified: Secondary | ICD-10-CM

## 2019-11-14 DIAGNOSIS — G8929 Other chronic pain: Secondary | ICD-10-CM

## 2019-11-14 DIAGNOSIS — G8928 Other chronic postprocedural pain: Secondary | ICD-10-CM

## 2019-11-14 LAB — COMPREHENSIVE METABOLIC PANEL
ALT: 23 U/L (ref 0–53)
AST: 29 U/L (ref 0–37)
Albumin: 4.6 g/dL (ref 3.5–5.2)
Alkaline Phosphatase: 53 U/L (ref 39–117)
BUN: 14 mg/dL (ref 6–23)
CO2: 27 mEq/L (ref 19–32)
Calcium: 9.4 mg/dL (ref 8.4–10.5)
Chloride: 99 mEq/L (ref 96–112)
Creatinine, Ser: 1.01 mg/dL (ref 0.40–1.50)
GFR: 73.89 mL/min (ref >60.00)
Glucose, Bld: 91 mg/dL (ref 70–99)
Potassium: 4.9 mEq/L (ref 3.5–5.1)
Sodium: 134 mEq/L — ABNORMAL LOW (ref 135–145)
Total Bilirubin: 0.8 mg/dL (ref 0.2–1.2)
Total Protein: 6.8 g/dL (ref 6.0–8.3)

## 2019-11-14 LAB — CBC WITH DIFFERENTIAL/PLATELET
Basophils Absolute: 0.1 10*3/uL (ref 0.0–0.1)
Basophils Relative: 0.9 % (ref 0.0–3.0)
Eosinophils Absolute: 0.5 10*3/uL (ref 0.0–0.7)
Eosinophils Relative: 7.8 % — ABNORMAL HIGH (ref 0.0–5.0)
HCT: 42.2 % (ref 39.0–52.0)
Hemoglobin: 14.2 g/dL (ref 13.0–17.0)
Lymphocytes Relative: 29.6 % (ref 12.0–46.0)
Lymphs Abs: 1.7 10*3/uL (ref 0.7–4.0)
MCHC: 33.7 g/dL (ref 30.0–36.0)
MCV: 92.7 fl (ref 78.0–100.0)
Monocytes Absolute: 0.7 10*3/uL (ref 0.1–1.0)
Monocytes Relative: 11.4 % (ref 3.0–12.0)
Neutro Abs: 2.9 10*3/uL (ref 1.4–7.7)
Neutrophils Relative %: 50.3 % (ref 43.0–77.0)
Platelets: 180 10*3/uL (ref 150.0–400.0)
RBC: 4.55 Mil/uL (ref 4.22–5.81)
RDW: 13 % (ref 11.5–15.5)
WBC: 5.9 10*3/uL (ref 4.0–10.5)

## 2019-11-14 LAB — C-REACTIVE PROTEIN: CRP: <1 mg/dL (ref 0.5–20.0)

## 2019-11-14 LAB — TSH: TSH: 1.04 u[IU]/mL (ref 0.35–4.50)

## 2019-11-14 LAB — T4, FREE: Free T4: 0.48 ng/dL — ABNORMAL LOW (ref 0.60–1.60)

## 2019-11-14 LAB — SEDIMENTATION RATE: Sed Rate: 1 mm/hr (ref 0–20)

## 2019-11-14 MED ORDER — GABAPENTIN 400 MG PO CAPS: ORAL_CAPSULE | ORAL | 1 refills | Status: DC

## 2019-11-14 MED ORDER — AMLODIPINE BESY-BENAZEPRIL HCL 10-40 MG PO CAPS: 1.0000 | ORAL_CAPSULE | Freq: Every day | ORAL | 1 refills | Status: DC

## 2019-11-14 MED ORDER — HYDROCHLOROTHIAZIDE 25 MG PO TABS: 25.0000 mg | ORAL_TABLET | Freq: Every day | ORAL | 1 refills | Status: DC

## 2019-11-14 MED ORDER — NORTRIPTYLINE HCL 75 MG PO CAPS: 150.0000 mg | ORAL_CAPSULE | Freq: Every day | ORAL | 1 refills | Status: DC

## 2019-11-14 MED ORDER — HYDROCODONE-ACETAMINOPHEN 10-325 MG PO TABS: 1.0000 | ORAL_TABLET | Freq: Three times a day (TID) | ORAL | 0 refills | Status: DC | PRN

## 2019-11-14 NOTE — Patient Instructions (Signed)
Added hctz today for BP.  If you want to taper down gabapentin: start by taking take every 12 hours for 2 weeks, then cut done to just before bed for 2 weeks. Then before bed every other day for 2 week.   I referred you to infectious disease - they should be calling you to schedule.   We will call you with your labs and image results.   I have ordered a Chest xray you can get today before 400pm at [facility] ([address])   They will call you to schedule an echo of your heart.    Endocarditis  Endocarditis is an infection of the heart valves or an infection of the inner layer of the heart (endocardium). Endocarditis can cause growths on the heart valves or inside the heart. Over time, these growths can destroy heart tissue and cause heart failure or problems with the heart rhythm. They can also cause a stroke if they break away and block an artery in the brain. Early treatment offers the best chance for curing endocarditis and preventing complications. What are the causes? This condition may be caused by:  Germs that normally live in or on your body. The germs that most commonly cause endocarditis are bacteria.  Fungus.  Cancer.  Connective tissue disease. What increases the risk? This condition is more likely to develop in people who have:  A heart defect.  Artificial (prosthetic) heart valves.  An abnormal or damaged heart valve.  A history of endocarditis.  IV drug abuse. Having certain procedures may also increase the risk of germs getting into the heart or bloodstream. What are the signs or symptoms? Symptoms of this condition may start suddenly, or they may start slowly and gradually get worse. Symptoms include:  Fever.  Chills.  Night sweats.  Muscle aches.  Fatigue.  Weakness.  Shortness of breath.  Chest pain.  Blood spots in the eyes.  Bleeding under the fingernails or toenails.  Painless red spots on the palms.  Painful lumps in the  fingertips or toes.  Swelling in the feet or ankles. How is this diagnosed? This condition may be diagnosed based on:  A physical exam. Your health care provider will listen to your heart to check for abnormal heart sounds (murmur). He or she may also use a scope to check for bleeding at the back of your eyes (retinas).  Tests. They may include: ? Blood tests to look for the germs that cause endocarditis. ? Imaging tests. These include a chest X-ray, CT scan, or echocardiogram. A type of echocardiogram called a transesophageal echocardiogram may be done to look at heart valves more closely. How is this treated? Treatment for this condition depends on the cause of the endocarditis. Treatment may include:  Antibiotic medicines. These may be given through an IV line or taken by mouth. You may need to be on more than one antibiotic medicine.  Surgery to replace your heart valve. You may need surgery if: ? The endocarditis does not respond to treatment. ? You develop complications. ? Your heart valve is severely damaged. Follow these instructions at home: Medicines  Take over-the-counter and prescription medicines only as told by your health care provider.  If you were prescribed an antibiotic medicine, take it as told by your health care provider. Do not stop taking the antibiotic even if you start to feel better. You may need to be on intravenous or oral antibiotics for several weeks.  Do not use IV drugs  unless it is part of your medical treatment. Lifestyle  Do not get tattoos or body piercings.  Practice good oral hygiene. This includes: ? Brushing and flossing regularly. ? Scheduling routine dental appointments.  Do not use any products that contain nicotine or tobacco, such as cigarettes, e-cigarettes, and chewing tobacco. If you need help quitting, ask your health care provider.  If you drink alcohol: ? Limit how much you use to:  0-1 drink a day for women.  0-2 drinks a  day for men. ? Be aware of how much alcohol is in your drink. In the [place] one drink equals one typical bottle of beer (12 oz), one-half glass of wine (5 oz), or one shot of hard liquor (1 oz). General instructions  Let your health care provider know before you have any dental or surgical procedures. You may need to take antibiotics before the procedure.  Tell all of your health care providers, including your dentist, that you have had endocarditis.  Gradually resume your usual activities.  Keep all follow-up visits as told by your health care provider. This is important. Contact a health care provider if:  You have a fever.  Your symptoms do not improve.  Your symptoms get worse.  Your symptoms come back. Get help right away if:  You have trouble breathing.  You have chest pain.  You have any symptoms of a stroke. "BE FAST" is an easy way to remember the main warning signs of a stroke: ? B - Balance. Signs are dizziness, sudden trouble walking, or loss of balance. ? E - Eyes. Signs are trouble seeing or a sudden change in vision. ? F - Face. Signs are sudden weakness or numbness of the face, or the face or eyelid drooping on one side. ? A - Arms. Signs are weakness or numbness in an arm. This happens suddenly and usually on one side of the body. ? S - Speech. Signs are sudden trouble speaking, slurred speech, or trouble understanding what people say. ? T - Time. Time to call emergency services. Write down what time symptoms started.  You have other signs of a stroke, such as: ? A sudden, severe headache with no known cause. ? Nausea or vomiting. ? Seizure. These symptoms may represent a serious problem that is an emergency. Do not wait to see if the symptoms will go away. Get medical help right away. Call your local emergency services (911 in the [place] Do not drive yourself to the hospital. Summary  Endocarditis is an infection of the heart valves or inner layer of the  heart (endocardium). It is caused by bacteria or a fungus.  Having certain heart conditions or procedures may increase the risk of endocarditis.  Antibiotics are an important treatment for endocarditis. Take these medicines as told by your health care provider. Do not stop taking them even if you start to feel better.  Tell all of your health care providers, including your dentist, that you have had endocarditis. This information is not intended to replace advice given to you by your health care provider. Make sure you discuss any questions you have with your health care provider. Document Revised: 05/23/2018 Document Reviewed: 05/23/2018 Elsevier Patient Education  [place].
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
## 2019-11-14 NOTE — Telephone Encounter (Signed)
I called patient this evening and discussed his lab findings in detail with him.  His chest x-ray is normal. His blood cell counts were normal, with the exception of elevation in his eosinophils.  Eosinophils are a type of white blood cell that can become increased during certain infectious disease processes.   His inflammatory markers were normal. His liver and kidney function were normal. His thyroid tests are also mildly abnormal.  With a normal TSH and abnormal T4 free (low).  This particular pattern of abnormality, if accurate, is seen when there is problems with the pituitary gland in the brain more so than the thyroid gland causing the dysfunction.>>  This is also potentially  from an infectious disease standpoint-and can be an indicator of more severe illness.  It is extremely important he follow through with the infectious disease referral.  If left inadequately treated [facility] spotted fever can cause great harm and even death. Patient reports understanding.   In the meantime, if he becomes fatigued, short of breath or headache>> he needs to be seen in the ED for eval and they would need to know he has had chronic fever and symptoms s/p outpt treatment for RMSF.     Rebecca: Please ask Diane to mark his referral to infectious disease as urgent please.

## 2019-11-15 NOTE — Telephone Encounter (Signed)
Brian Murray this is a urgent referral please to infectious disease

## 2019-11-16 NOTE — Telephone Encounter (Signed)
FYI

## 2019-11-16 NOTE — Telephone Encounter (Signed)
Patient has an appointment on 11/27/19

## 2019-11-17 ENCOUNTER — Ambulatory Visit: Payer: PRIVATE HEALTH INSURANCE

## 2019-11-26 ENCOUNTER — Ambulatory Visit: Payer: PRIVATE HEALTH INSURANCE

## 2019-11-27 ENCOUNTER — Encounter: Payer: Self-pay | Admitting: Internal Medicine

## 2019-11-27 ENCOUNTER — Ambulatory Visit: Payer: PRIVATE HEALTH INSURANCE | Admitting: Internal Medicine

## 2019-11-27 ENCOUNTER — Other Ambulatory Visit: Payer: Self-pay

## 2019-11-27 DIAGNOSIS — R509 Fever, unspecified: Secondary | ICD-10-CM

## 2019-11-27 HISTORY — DX: Fever, unspecified: R50.9

## 2019-11-27 NOTE — Progress Notes (Signed)
[place] [facility]      Reason for Consult: fever    Referring Physician: Dr. Raoul Pitch    Patient ID: Brian Murray, male    DOB: 1950-10-25, 69 y.o.   MRN: [id number]  HPI:   He is here for evaluation of ongoing fever for over 8 months.  He first noted fever incidentally when he was checked during a COVID evaluation, which was negative for COVID.  He had a fever up to 102 but otherwise relatively asymptomatic.   He has had a constant, but intermittent fever since that time, which is starting to diminish overall.  He has had temps up to 102 but more recently not higher than 99 or so.  No associated symptoms including no weight loss, no diarrhea, no dysuria, no rash, no joint swelling, no chills.  No new symptoms of pain in his neck at the area of his   Past Medical History:  Diagnosis Date  . Arthritis   . Chronic low back pain   . Chronic pain following surgery or procedure   . Elevated liver enzymes 09/2018   from Kratom use- resolved after discontinuation.   Marland Kitchen Headache   . History of vertebral fracture 1979  . Hyperlipemia   . Hypertension   . Insomnia     Prior to Admission medications   Medication Sig Start Date End Date Taking? Authorizing Provider  amLODipine-benazepril (LOTREL) 10-40 MG capsule Take 1 capsule by mouth daily. 11/14/19   Kuneff, Renee A, DO  Aspirin-Acetaminophen-Caffeine (EXCEDRIN PO) Take 1 tablet by mouth daily.    [provider]  cyclobenzaprine (FLEXERIL) 10 MG tablet Take 1 tablet (10 mg total) by mouth 3 (three) times daily as needed for muscle spasms. 08/16/19   Kuneff, Renee A, DO  gabapentin (NEURONTIN) 400 MG capsule Take 400 mg by mouth in the morning, 400 mg in the afternoon, 1200 mg at night 11/14/19   Kuneff, Renee A, DO  hydrochlorothiazide (HYDRODIURIL) 25 MG tablet Take 1 tablet (25 mg total) by mouth daily. 11/14/19   Kuneff, Renee A, DO  HYDROcodone-acetaminophen (NORCO) 10-325 MG tablet Take 1 tablet by mouth  every 8 (eight) hours as needed. 11/14/19   Kuneff, Renee A, DO  lovastatin (MEVACOR) 40 MG tablet Take 2 tablets (80 mg total) by mouth at bedtime. 08/16/19   Kuneff, Renee A, DO  nortriptyline (PAMELOR) 75 MG capsule Take 2 capsules (150 mg total) by mouth at bedtime. 11/14/19   Kuneff, Renee A, DO    Allergies  Allergen Reactions  . Other Other (See Comments)    Kratom- Elevated LFT and Hallucinations    Social History   Tobacco Use  . Smoking status: Never Smoker  . Smokeless tobacco: Never Used  Substance Use Topics  . Alcohol use: No  . Drug use: Yes    Types: Hydrocodone    Family History  Problem Relation Age of Onset  . CAD Mother   . COPD Mother   . Osteoporosis Mother   . Thyroid disease Mother   . Scleroderma Mother   . Diabetes Father   . Heart disease Father   . Hypertension Father     Review of Systems  Constitutional: negative for chills, sweats, fatigue, malaise, anorexia and weight loss Respiratory: negative for cough or sputum Gastrointestinal: negative for nausea and diarrhea Genitourinary: negative for frequency and dysuria Integument/breast: negative for rash Hematologic/lymphatic: negative for lymphadenopathy Musculoskeletal: negative for myalgias, arthralgias, neck pain and back pain All  other systems reviewed and are negative    Constitutional: in no apparent distress There were no vitals filed for this visit. EYES: anicteric ENMT: + mask Cardiovascular: Cor RRR Respiratory: CTA B; normal respiratory effort GI: Bowel sounds are normal, liver is not enlarged, spleen is not enlarged Musculoskeletal: no pedal edema noted Skin: negatives: no rash Hematologic: no cervical lad  Labs: Lab Results  Component Value Date   WBC 5.9 11/14/2019   HGB 14.2 11/14/2019   HCT 42.2 11/14/2019   MCV 92.7 11/14/2019   PLT 180.0 11/14/2019    Lab Results  Component Value Date   CREATININE 1.01 11/14/2019   BUN 14 11/14/2019   NA 134 (L) 11/14/2019     K 4.9 11/14/2019   CL 99 11/14/2019   CO2 27 11/14/2019    Lab Results  Component Value Date   ALT 23 11/14/2019   AST 29 11/14/2019   ALKPHOS 53 11/14/2019   BILITOT 0.8 11/14/2019     Assessment: FUO with no localizing symptoms.  I discussed with the patient that fever in general can be from infection, rheumatic disease, cancer or medication-induced.  There are no concerns though on history, recent labs, and no medication changes for over 5 years.  I discussed that typically, FUO can last up to one year and people generally do well if no localizing symptoms develop.  His work up has included labs and CXR, all wnl.  His RMSF was c/w old disease and a negative IgM.  Same with EBV.  CMV IgM negative.   At this point, there are a few blood tests that I will add to complete the work up but if no new concerns, further testing not indicated without new issues. Overall it seems to be diminishing.  He prefers to call if he has new concerns and I will be happy to see him then, otherwise he will return prn.  Plan: 1) hiv, protein electrophoresis, CK Follow up if he develops new symptoms, new concerns or worsening fever again.

## 2019-11-29 LAB — PROTEIN ELECTROPHORESIS, SERUM, WITH REFLEX
Albumin ELP: 4.5 g/dL (ref 3.8–4.8)
Alpha 1: 0.3 g/dL (ref 0.2–0.3)
Alpha 2: 0.6 g/dL (ref 0.5–0.9)
Beta 2: 0.3 g/dL (ref 0.2–0.5)
Beta Globulin: 0.5 g/dL (ref 0.4–0.6)
Gamma Globulin: 0.8 g/dL (ref 0.8–1.7)
Total Protein: 6.9 g/dL (ref 6.1–8.1)

## 2019-11-29 LAB — HIV ANTIBODY (ROUTINE TESTING W REFLEX): HIV 1&2 Ab, 4th Generation: NONREACTIVE

## 2019-11-29 LAB — CK: Total CK: 92 U/L (ref 44–196)

## 2019-12-01 ENCOUNTER — Telehealth: Payer: Self-pay

## 2019-12-01 NOTE — Telephone Encounter (Signed)
-----   Message from Gardiner Barefoot, MD sent at 12/01/2019 11:57 AM EST ----- Please let him know all his labs look fine, no concerns.

## 2019-12-01 NOTE — Telephone Encounter (Signed)
Patient called and left voicemail regarding results.  Brian Murray

## 2019-12-04 ENCOUNTER — Ambulatory Visit: Payer: PRIVATE HEALTH INSURANCE

## 2019-12-06 ENCOUNTER — Telehealth: Payer: Self-pay | Admitting: Family Medicine

## 2019-12-06 ENCOUNTER — Encounter: Payer: Self-pay | Admitting: Family Medicine

## 2019-12-06 ENCOUNTER — Ambulatory Visit (HOSPITAL_BASED_OUTPATIENT_CLINIC_OR_DEPARTMENT_OTHER)
Admission: RE | Admit: 2019-12-06 | Discharge: 2019-12-06 | Disposition: A | Discharge status: Home / Self Care | Payer: PRIVATE HEALTH INSURANCE | Source: Ambulatory Visit | Attending: Family Medicine | Admitting: Family Medicine

## 2019-12-06 ENCOUNTER — Other Ambulatory Visit: Payer: Self-pay

## 2019-12-06 DIAGNOSIS — R600 Localized edema: Secondary | ICD-10-CM | POA: Diagnosis not present

## 2019-12-06 DIAGNOSIS — R509 Fever, unspecified: Secondary | ICD-10-CM | POA: Insufficient documentation

## 2019-12-06 DIAGNOSIS — I77810 Thoracic aortic ectasia: Secondary | ICD-10-CM | POA: Insufficient documentation

## 2019-12-06 DIAGNOSIS — A770 Spotted fever due to Rickettsia rickettsii: Secondary | ICD-10-CM | POA: Insufficient documentation

## 2019-12-06 DIAGNOSIS — R5383 Other fatigue: Secondary | ICD-10-CM

## 2019-12-06 NOTE — Telephone Encounter (Signed)
Please inform patient his echocardiogram overall looks great with normal function of his heart. The main vessel that comes out of the heart, called the aorta,  Has a mild dilatation above normal.  If dilatation increases, this then forms an aneurysm.  Typically these are followed initially in 6 months with repeat imaging to ensure no growth and then yearly if remains stable. I have referred him to cardiology for them to evaluate and discuss with him.  Again this is very mild-but since it is new and he has had more fatigue and lower extremity edema he should have cardiology evaluation.

## 2019-12-06 NOTE — Progress Notes (Signed)
  2D Echocardiogram has been performed.  Sinda Du 12/06/2019, 10:38 AM

## 2019-12-06 NOTE — Telephone Encounter (Signed)
Pt was called and given information. He verbalized understanding  

## 2019-12-19 ENCOUNTER — Other Ambulatory Visit: Payer: Self-pay

## 2019-12-19 ENCOUNTER — Encounter: Payer: Self-pay | Admitting: Cardiology

## 2019-12-19 ENCOUNTER — Ambulatory Visit: Payer: PRIVATE HEALTH INSURANCE | Admitting: Cardiology

## 2019-12-19 VITALS — BP 120/72 | HR 72 | Temp 98.1°F | Ht 70.0 in | Wt 203.8 lb

## 2019-12-19 DIAGNOSIS — Z01812 Encounter for preprocedural laboratory examination: Secondary | ICD-10-CM | POA: Diagnosis not present

## 2019-12-19 DIAGNOSIS — E785 Hyperlipidemia, unspecified: Secondary | ICD-10-CM

## 2019-12-19 DIAGNOSIS — I77810 Thoracic aortic ectasia: Secondary | ICD-10-CM | POA: Diagnosis not present

## 2019-12-19 DIAGNOSIS — I10 Essential (primary) hypertension: Secondary | ICD-10-CM | POA: Diagnosis not present
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
## 2019-12-19 NOTE — Patient Instructions (Addendum)
Medication Instructions:  Your physician recommends that you continue on your current medications as directed. Please refer to the Current Medication list given to you today.  *If you need a refill on your cardiac medications before your next appointment, please call your pharmacy*  Lab Work: NONE  Testing/Procedures:  CT Angio of Chest/Aorta: Non-Cardiac CT scanning, (CAT scanning), is a noninvasive, special x-ray that produces cross-sectional images of the body using x-rays and a computer. CT scans help physicians diagnose and treat medical conditions. For some CT exams, a contrast material is used to enhance visibility in the area of the body being studied. CT scans provide greater clarity and reveal more details than regular x-ray exams.  Follow-Up: At [facility], you and your health needs are our priority.  As part of our continuing mission to provide you with exceptional heart care, we have created designated Provider Care Teams.  These Care Teams include your primary Cardiologist (physician) and Advanced Practice Providers (APPs -  Physician Assistants and Nurse Practitioners) who all work together to provide you with the care you need, when you need it.  We recommend signing up for the patient portal called "MyChart".  Sign up information is provided on this After Visit Summary.  MyChart is used to connect with patients for Virtual Visits (Telemedicine).  Patients are able to view lab/test results, encounter notes, upcoming appointments, etc.  Non-urgent messages can be sent to your provider as well.   To learn more about what you can do with MyChart, go to [email or web address].    Your next appointment:   6 month(s)  The format for your next appointment:   In Person  Provider:   Epifanio Lesches, MD

## 2019-12-20 ENCOUNTER — Ambulatory Visit (INDEPENDENT_AMBULATORY_CARE_PROVIDER_SITE_OTHER)
Admission: RE | Admit: 2019-12-20 | Discharge: 2019-12-20 | Disposition: A | Discharge status: Home / Self Care | Payer: PRIVATE HEALTH INSURANCE | Source: Ambulatory Visit | Attending: Cardiology | Admitting: Cardiology

## 2019-12-20 DIAGNOSIS — I77810 Thoracic aortic ectasia: Secondary | ICD-10-CM

## 2019-12-20 MED ORDER — IOHEXOL 300 MG/ML  SOLN
100.0000 mL | Freq: Once | INTRAMUSCULAR | Status: AC | PRN
  Administered 2019-12-20: 100 mL via INTRAVENOUS

## 2020-02-05 ENCOUNTER — Encounter: Payer: Self-pay | Admitting: Family Medicine

## 2020-02-05 ENCOUNTER — Ambulatory Visit: Payer: PRIVATE HEALTH INSURANCE | Admitting: Family Medicine

## 2020-02-05 ENCOUNTER — Other Ambulatory Visit: Payer: Self-pay

## 2020-02-05 VITALS — BP 138/78 | HR 84 | Temp 98.3°F | Resp 17 | Ht 70.0 in | Wt 201.5 lb

## 2020-02-05 DIAGNOSIS — I77810 Thoracic aortic ectasia: Secondary | ICD-10-CM

## 2020-02-05 DIAGNOSIS — I10 Essential (primary) hypertension: Secondary | ICD-10-CM

## 2020-02-05 DIAGNOSIS — M5010 Cervical disc disorder with radiculopathy, unspecified cervical region: Secondary | ICD-10-CM

## 2020-02-05 DIAGNOSIS — M545 Low back pain, unspecified: Secondary | ICD-10-CM

## 2020-02-05 DIAGNOSIS — G8929 Other chronic pain: Secondary | ICD-10-CM

## 2020-02-05 DIAGNOSIS — Z79899 Other long term (current) drug therapy: Secondary | ICD-10-CM

## 2020-02-05 DIAGNOSIS — M542 Cervicalgia: Secondary | ICD-10-CM

## 2020-02-05 DIAGNOSIS — M1990 Unspecified osteoarthritis, unspecified site: Secondary | ICD-10-CM

## 2020-02-05 DIAGNOSIS — G8928 Other chronic postprocedural pain: Secondary | ICD-10-CM

## 2020-02-05 DIAGNOSIS — E785 Hyperlipidemia, unspecified: Secondary | ICD-10-CM | POA: Diagnosis not present

## 2020-02-05 DIAGNOSIS — F1190 Opioid use, unspecified, uncomplicated: Secondary | ICD-10-CM

## 2020-02-05 DIAGNOSIS — G4700 Insomnia, unspecified: Secondary | ICD-10-CM

## 2020-02-05 MED ORDER — AMLODIPINE BESY-BENAZEPRIL HCL 10-40 MG PO CAPS: 1.0000 | ORAL_CAPSULE | Freq: Every day | ORAL | 1 refills | Status: DC

## 2020-02-05 MED ORDER — HYDROCHLOROTHIAZIDE 25 MG PO TABS: 25.0000 mg | ORAL_TABLET | Freq: Every day | ORAL | 1 refills | Status: DC

## 2020-02-05 MED ORDER — NORTRIPTYLINE HCL 75 MG PO CAPS: 150.0000 mg | ORAL_CAPSULE | Freq: Every day | ORAL | 1 refills | Status: DC

## 2020-02-05 MED ORDER — HYDROCODONE-ACETAMINOPHEN 10-325 MG PO TABS: 1.0000 | ORAL_TABLET | Freq: Three times a day (TID) | ORAL | 0 refills | Status: DC | PRN
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
## 2020-02-05 NOTE — Patient Instructions (Signed)
Glad you are doing well.  I have refilled your medications.  Monitor your edema> if worsens would increase diuretic.   Follow up in 3 months for chronic conditions.

## 2020-05-06 ENCOUNTER — Other Ambulatory Visit: Payer: Self-pay

## 2020-05-06 ENCOUNTER — Ambulatory Visit: Payer: PRIVATE HEALTH INSURANCE | Admitting: Family Medicine

## 2020-05-06 ENCOUNTER — Encounter: Payer: Self-pay | Admitting: Family Medicine

## 2020-05-06 VITALS — BP 117/71 | HR 82 | Temp 98.6°F | Ht 70.0 in | Wt 196.0 lb

## 2020-05-06 DIAGNOSIS — Z8781 Personal history of (healed) traumatic fracture: Secondary | ICD-10-CM

## 2020-05-06 DIAGNOSIS — E785 Hyperlipidemia, unspecified: Secondary | ICD-10-CM | POA: Diagnosis not present

## 2020-05-06 DIAGNOSIS — M1990 Unspecified osteoarthritis, unspecified site: Secondary | ICD-10-CM

## 2020-05-06 DIAGNOSIS — F1190 Opioid use, unspecified, uncomplicated: Secondary | ICD-10-CM

## 2020-05-06 DIAGNOSIS — Z23 Encounter for immunization: Secondary | ICD-10-CM | POA: Diagnosis not present

## 2020-05-06 DIAGNOSIS — G8928 Other chronic postprocedural pain: Secondary | ICD-10-CM

## 2020-05-06 DIAGNOSIS — G4700 Insomnia, unspecified: Secondary | ICD-10-CM

## 2020-05-06 DIAGNOSIS — M545 Low back pain, unspecified: Secondary | ICD-10-CM

## 2020-05-06 DIAGNOSIS — I10 Essential (primary) hypertension: Secondary | ICD-10-CM | POA: Diagnosis not present

## 2020-05-06 DIAGNOSIS — G8929 Other chronic pain: Secondary | ICD-10-CM

## 2020-05-06 DIAGNOSIS — M5010 Cervical disc disorder with radiculopathy, unspecified cervical region: Secondary | ICD-10-CM

## 2020-05-06 DIAGNOSIS — R252 Cramp and spasm: Secondary | ICD-10-CM

## 2020-05-06 DIAGNOSIS — M542 Cervicalgia: Secondary | ICD-10-CM

## 2020-05-06 MED ORDER — CYCLOBENZAPRINE HCL 10 MG PO TABS: 10.0000 mg | ORAL_TABLET | Freq: Three times a day (TID) | ORAL | 3 refills | Status: DC | PRN

## 2020-05-06 MED ORDER — LOVASTATIN 40 MG PO TABS: 80.0000 mg | ORAL_TABLET | Freq: Every day | ORAL | 3 refills | Status: DC

## 2020-05-06 MED ORDER — NORTRIPTYLINE HCL 75 MG PO CAPS: 75.0000 mg | ORAL_CAPSULE | Freq: Every day | ORAL | 1 refills | Status: DC

## 2020-05-06 MED ORDER — HYDROCHLOROTHIAZIDE 25 MG PO TABS: 25.0000 mg | ORAL_TABLET | Freq: Every day | ORAL | 1 refills | Status: DC

## 2020-05-06 MED ORDER — AMLODIPINE BESY-BENAZEPRIL HCL 10-40 MG PO CAPS: 1.0000 | ORAL_CAPSULE | Freq: Every day | ORAL | 1 refills | Status: DC

## 2020-05-06 MED ORDER — HYDROCODONE-ACETAMINOPHEN 10-325 MG PO TABS: 1.0000 | ORAL_TABLET | Freq: Three times a day (TID) | ORAL | 0 refills | Status: DC | PRN

## 2020-05-06 NOTE — Patient Instructions (Signed)
[place]ico coconut water or Gatorade daily to keep electrolytes balanced. Should help with cramps.  I have refilled your meds.  Blood pressure looks great.    Follow up in 3 months.
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
## 2020-06-17 ENCOUNTER — Other Ambulatory Visit: Payer: Self-pay

## 2020-06-17 ENCOUNTER — Telehealth (INDEPENDENT_AMBULATORY_CARE_PROVIDER_SITE_OTHER): Payer: PRIVATE HEALTH INSURANCE | Admitting: Family Medicine

## 2020-06-17 ENCOUNTER — Encounter: Payer: Self-pay | Admitting: Family Medicine

## 2020-06-17 VITALS — BP 135/75 | Ht 70.0 in | Wt 186.0 lb

## 2020-06-17 DIAGNOSIS — R509 Fever, unspecified: Secondary | ICD-10-CM | POA: Diagnosis not present

## 2020-06-17 DIAGNOSIS — U071 COVID-19: Secondary | ICD-10-CM | POA: Diagnosis not present

## 2020-06-17 MED ORDER — PREDNISONE 50 MG PO TABS: 50.0000 mg | ORAL_TABLET | Freq: Every day | ORAL | 0 refills | Status: DC

## 2020-06-17 MED ORDER — AZITHROMYCIN 250 MG PO TABS: ORAL_TABLET | ORAL | 0 refills | Status: DC

## 2020-06-17 NOTE — Progress Notes (Signed)
VIRTUAL VISIT VIA VIDEO  I connected with Brian Murray on 06/17/20 at  4:00 PM EDT by elemedicine application and verified that I am speaking with the correct person using two identifiers. Location patient: Home Location provider: [facility], Office Persons participating in the virtual visit: Patient, Dr. Claiborne Billings and R.Baker, LPN  I discussed the limitations of evaluation and management by telemedicine and the availability of in person appointments. The patient expressed understanding and agreed to proceed.   SUBJECTIVE Chief Complaint  Patient presents with  . COVID-19    8/27- symptoms improving, temp 100-101.5, O2 within 93-97%    HPI: Brian Murray is a 69 y.o. male present for COVID positive illness- Vaccine breakthrough infection. Pfizer covid  Series completed in [place]. Pt had a + COVID test at [facility] 8/27. Symptom onset 8/24. Currently pt states he symptoms started with flu-like feelings. He had a temperature, headache, malaise and felt weak. By Thursday he felt "pretty bad." Since Thursday he has seen improvement in his symptoms. He is eating and drinking well. He has not had any shortness of breath or chest tightness. He had developed a mild cough over the weekend, but that has improved.  Oxygen levels are between 93-97%. He has had fevers between 100-101.5 F.    ROS: See pertinent positives and negatives per HPI.  Patient Active Problem List   Diagnosis Date Noted  . Leg cramps 05/06/2020  . Mild ascending aorta dilatation (HCC) 12/06/2019  . Adverse drug interaction with herbal supplement 11/10/2018  . Elevated bilirubin 11/10/2018  . Elevated AST (SGOT) 11/10/2018  . Chronic pain following surgery or procedure 11/10/2018  . Mass of hand, right 10/06/2018  . Pain of cervical spine 07/29/2016  . Cervical disc disorder with radiculopathy 07/22/2016  . Encounter for chronic pain management 05/26/2016  . Hypertension 05/20/2016  . Hyperlipidemia  05/20/2016  . Encounter for long-term (current) use of medications 05/20/2016  . Chronic narcotic use 05/20/2016  . Insomnia   . Chronic low back pain   . Arthritis   . History of vertebral fracture 10/19/1977    Social History   Tobacco Use  . Smoking status: Never Smoker  . Smokeless tobacco: Never Used  Substance Use Topics  . Alcohol use: No    Current Outpatient Medications:  .  amLODipine-benazepril (LOTREL) 10-40 MG capsule, Take 1 capsule by mouth daily., Disp: 90 capsule, Rfl: 1 .  Aspirin-Acetaminophen-Caffeine (EXCEDRIN PO), Take 1 tablet by mouth daily., Disp: , Rfl:  .  cyclobenzaprine (FLEXERIL) 10 MG tablet, Take 1 tablet (10 mg total) by mouth 3 (three) times daily as needed for muscle spasms., Disp: 90 tablet, Rfl: 3 .  hydrochlorothiazide (HYDRODIURIL) 25 MG tablet, Take 1 tablet (25 mg total) by mouth daily., Disp: 90 tablet, Rfl: 1 .  HYDROcodone-acetaminophen (NORCO) 10-325 MG tablet, Take 1 tablet by mouth every 8 (eight) hours as needed., Disp: 270 tablet, Rfl: 0 .  lovastatin (MEVACOR) 40 MG tablet, Take 2 tablets (80 mg total) by mouth at bedtime., Disp: 180 tablet, Rfl: 3 .  nortriptyline (PAMELOR) 75 MG capsule, Take 1 capsule (75 mg total) by mouth at bedtime., Disp: 90 capsule, Rfl: 1 .  azithromycin (ZITHROMAX) 250 MG tablet, 2 tabs day 1, then 1 tab qd, Disp: 6 tablet, Rfl: 0 .  predniSONE (DELTASONE) 50 MG tablet, Take 1 tablet (50 mg total) by mouth daily with breakfast., Disp: 5 tablet, Rfl: 0  Allergies  Allergen Reactions  . Other Other (See  Comments)    Kratom- Elevated LFT and Hallucinations    OBJECTIVE: BP 135/75   Ht 5\' 10"  (1.778 m)   Wt 186 lb (84.4 kg)   BMI 26.69 kg/m  Gen: No acute distress. Nontoxic in appearance.  HENT: AT. [place].  MMM.  Eyes:Pupils Equal Round Reactive to light, Extraocular movements intact,  Conjunctiva without redness, discharge or icterus. Chest: Cough or shortness of breath not present.  Skin: no rashes,  purpura or petechiae.  Neuro:   Alert. Oriented x3  Psych: Normal affect and demeanor. Normal speech. Normal thought content and judgment.  ASSESSMENT AND PLAN: Brian Murray is a 69 y.o. male present for  Lab test positive for detection of COVID-19 virus/fever Improving since onset 06/11/2020 Rest, hydrate.   flonase, mucinex (DM if cough), he has tessalon perles.  azith and prednisone to use of worsening or bronchitis features present/fever persist..  He understands to self isolate at least 10-14 days from onset. Continue to monitor for fever, oxygen saturations and cough. F/U 2 weeks of not improved- sooner if worsening    06/13/2020, DO 06/17/2020   Return if symptoms worsen or fail to improve.  No orders of the defined types were placed in this encounter.  Meds ordered this encounter  Medications  . azithromycin (ZITHROMAX) 250 MG tablet    Sig: 2 tabs day 1, then 1 tab qd    Dispense:  6 tablet    Refill:  0  . predniSONE (DELTASONE) 50 MG tablet    Sig: Take 1 tablet (50 mg total) by mouth daily with breakfast.    Dispense:  5 tablet    Refill:  0   Referral Orders  No referral(s) requested today

## 2020-06-17 NOTE — Patient Instructions (Signed)
COVID-19 COVID-19 is a respiratory infection that is caused by a virus called severe acute respiratory syndrome coronavirus 2 (SARS-CoV-2). The disease is also known as coronavirus disease or novel coronavirus. In some people, the virus may not cause any symptoms. In others, it may cause a serious infection. The infection can get worse quickly and can lead to complications, such as:  Pneumonia, or infection of the lungs.  Acute respiratory distress syndrome or ARDS. This is a condition in which fluid build-up in the lungs prevents the lungs from filling with air and passing oxygen into the blood.  Acute respiratory failure. This is a condition in which there is not enough oxygen passing from the lungs to the body or when carbon dioxide is not passing from the lungs out of the body.  Sepsis or septic shock. This is a serious bodily reaction to an infection.  Blood clotting problems.  Secondary infections due to bacteria or fungus.  Organ failure. This is when your body's organs stop working. The virus that causes COVID-19 is contagious. This means that it can spread from person to person through droplets from coughs and sneezes (respiratory secretions). What are the causes? This illness is caused by a virus. You may catch the virus by:  Breathing in droplets from an infected person. Droplets can be spread by a person breathing, speaking, singing, coughing, or sneezing.  Touching something, like a table or a doorknob, that was exposed to the virus (contaminated) and then touching your mouth, nose, or eyes. What increases the risk? Risk for infection You are more likely to be infected with this virus if you:  Are within 6 feet (2 meters) of a person with COVID-19.  Provide care for or live with a person who is infected with COVID-19.  Spend time in crowded indoor spaces or live in shared housing. Risk for serious illness You are more likely to become seriously ill from the virus if you:   Are 50 years of age or older. The higher your age, the more you are at risk for serious illness.  Live in a nursing home or long-term care facility.  Have cancer.  Have a long-term (chronic) disease such as: ? Chronic lung disease, including chronic obstructive pulmonary disease or asthma. ? A long-term disease that lowers your body's ability to fight infection (immunocompromised). ? Heart disease, including heart failure, a condition in which the arteries that lead to the heart become narrow or blocked (coronary artery disease), a disease which makes the heart muscle thick, weak, or stiff (cardiomyopathy). ? Diabetes. ? Chronic kidney disease. ? Sickle cell disease, a condition in which red blood cells have an abnormal "sickle" shape. ? Liver disease.  Are obese. What are the signs or symptoms? Symptoms of this condition can range from mild to severe. Symptoms may appear any time from 2 to 14 days after being exposed to the virus. They include:  A fever or chills.  A cough.  Difficulty breathing.  Headaches, body aches, or muscle aches.  Runny or stuffy (congested) nose.  A sore throat.  New loss of taste or smell. Some people may also have stomach problems, such as nausea, vomiting, or diarrhea. Other people may not have any symptoms of COVID-19. How is this diagnosed? This condition may be diagnosed based on:  Your signs and symptoms, especially if: ? You live in an area with a COVID-19 outbreak. ? You recently traveled to or from an area where the virus is common. ? You   provide care for or live with a person who was diagnosed with COVID-19. ? You were exposed to a person who was diagnosed with COVID-19.  A physical exam.  Lab tests, which may include: ? Taking a sample of fluid from the back of your nose and throat (nasopharyngeal fluid), your nose, or your throat using a swab. ? A sample of mucus from your lungs (sputum). ? Blood tests.  Imaging tests, which  may include, X-rays, CT scan, or ultrasound. How is this treated? At present, there is no medicine to treat COVID-19. Medicines that treat other diseases are being used on a trial basis to see if they are effective against COVID-19. Your health care provider will talk with you about ways to treat your symptoms. For most people, the infection is mild and can be managed at home with rest, fluids, and over-the-counter medicines. Treatment for a serious infection usually takes places in a hospital intensive care unit (ICU). It may include one or more of the following treatments. These treatments are given until your symptoms improve.  Receiving fluids and medicines through an IV.  Supplemental oxygen. Extra oxygen is given through a tube in the nose, a face mask, or a hood.  Positioning you to lie on your stomach (prone position). This makes it easier for oxygen to get into the lungs.  Continuous positive airway pressure (CPAP) or bi-level positive airway pressure (BPAP) machine. This treatment uses mild air pressure to keep the airways open. A tube that is connected to a motor delivers oxygen to the body.  Ventilator. This treatment moves air into and out of the lungs by using a tube that is placed in your windpipe.  Tracheostomy. This is a procedure to create a hole in the neck so that a breathing tube can be inserted.  Extracorporeal membrane oxygenation (ECMO). This procedure gives the lungs a chance to recover by taking over the functions of the heart and lungs. It supplies oxygen to the body and removes carbon dioxide. Follow these instructions at home: Lifestyle  If you are sick, stay home except to get medical care. Your health care provider will tell you how long to stay home. Call your health care provider before you go for medical care.  Rest at home as told by your health care provider.  Do not use any products that contain nicotine or tobacco, such as cigarettes, e-cigarettes, and  chewing tobacco. If you need help quitting, ask your health care provider.  Return to your normal activities as told by your health care provider. Ask your health care provider what activities are safe for you. General instructions  Take over-the-counter and prescription medicines only as told by your health care provider.  Drink enough fluid to keep your urine pale yellow.  Keep all follow-up visits as told by your health care provider. This is important. How is this prevented?  There is no vaccine to help prevent COVID-19 infection. However, there are steps you can take to protect yourself and others from this virus. To protect yourself:   Do not travel to areas where COVID-19 is a risk. The areas where COVID-19 is reported change often. To identify high-risk areas and travel restrictions, check the CDC travel website: wwwnc.cdc.gov/travel/notices  If you live in, or must travel to, an area where COVID-19 is a risk, take precautions to avoid infection. ? Stay away from people who are sick. ? Wash your hands often with soap and water for 20 seconds. If soap and water   are not available, use an alcohol-based hand sanitizer. ? Avoid touching your mouth, face, eyes, or nose. ? Avoid going out in public, follow guidance from your state and local health authorities. ? If you must go out in public, wear a cloth face covering or face mask. Make sure your mask covers your nose and mouth. ? Avoid crowded indoor spaces. Stay at least 6 feet (2 meters) away from others. ? Disinfect objects and surfaces that are frequently touched every day. This may include:  Counters and tables.  Doorknobs and light switches.  Sinks and faucets.  Electronics, such as phones, remote controls, keyboards, computers, and tablets. To protect others: If you have symptoms of COVID-19, take steps to prevent the virus from spreading to others.  If you think you have a COVID-19 infection, contact your health care  provider right away. Tell your health care team that you think you may have a COVID-19 infection.  Stay home. Leave your house only to seek medical care. Do not use public transport.  Do not travel while you are sick.  Wash your hands often with soap and water for 20 seconds. If soap and water are not available, use alcohol-based hand sanitizer.  Stay away from other members of your household. Let healthy household members care for children and pets, if possible. If you have to care for children or pets, wash your hands often and wear a mask. If possible, stay in your own room, separate from others. Use a different bathroom.  Make sure that all people in your household wash their hands well and often.  Cough or sneeze into a tissue or your sleeve or elbow. Do not cough or sneeze into your hand or into the air.  Wear a cloth face covering or face mask. Make sure your mask covers your nose and mouth. Where to find more information  Centers for Disease Control and Prevention: www.cdc.gov/coronavirus/2019-ncov/index.html  World Health Organization: www.who.int/health-topics/coronavirus Contact a health care provider if:  You live in or have traveled to an area where COVID-19 is a risk and you have symptoms of the infection.  You have had contact with someone who has COVID-19 and you have symptoms of the infection. Get help right away if:  You have trouble breathing.  You have pain or pressure in your chest.  You have confusion.  You have bluish lips and fingernails.  You have difficulty waking from sleep.  You have symptoms that get worse. These symptoms may represent a serious problem that is an emergency. Do not wait to see if the symptoms will go away. Get medical help right away. Call your local emergency services (911 in the [place] Do not drive yourself to the hospital. Let the emergency medical personnel know if you think you have COVID-19. Summary  COVID-19 is a  respiratory infection that is caused by a virus. It is also known as coronavirus disease or novel coronavirus. It can cause serious infections, such as pneumonia, acute respiratory distress syndrome, acute respiratory failure, or sepsis.  The virus that causes COVID-19 is contagious. This means that it can spread from person to person through droplets from breathing, speaking, singing, coughing, or sneezing.  You are more likely to develop a serious illness if you are 50 years of age or older, have a weak immune system, live in a nursing home, or have chronic disease.  There is no medicine to treat COVID-19. Your health care provider will talk with you about ways to treat your symptoms.    Take steps to protect yourself and others from infection. Wash your hands often and disinfect objects and surfaces that are frequently touched every day. Stay away from people who are sick and wear a mask if you are sick. This information is not intended to replace advice given to you by your health care provider. Make sure you discuss any questions you have with your health care provider. Document Revised: 08/04/2019 Document Reviewed: 11/10/2018 Elsevier Patient Education  2020 [employer]  

## 2020-06-17 NOTE — Progress Notes (Signed)
Pre visit review using our clinic review tool, if applicable. No additional management support is needed unless otherwise documented below in the visit note. 

## 2020-08-07 ENCOUNTER — Ambulatory Visit: Payer: PRIVATE HEALTH INSURANCE | Admitting: Family Medicine

## 2020-08-07 ENCOUNTER — Encounter: Payer: Self-pay | Admitting: Family Medicine

## 2020-08-07 ENCOUNTER — Other Ambulatory Visit: Payer: Self-pay

## 2020-08-07 VITALS — BP 114/72 | HR 95 | Temp 98.8°F | Ht 70.0 in | Wt 186.0 lb

## 2020-08-07 DIAGNOSIS — G4700 Insomnia, unspecified: Secondary | ICD-10-CM

## 2020-08-07 DIAGNOSIS — F1190 Opioid use, unspecified, uncomplicated: Secondary | ICD-10-CM | POA: Diagnosis not present

## 2020-08-07 DIAGNOSIS — M1990 Unspecified osteoarthritis, unspecified site: Secondary | ICD-10-CM

## 2020-08-07 DIAGNOSIS — E785 Hyperlipidemia, unspecified: Secondary | ICD-10-CM | POA: Diagnosis not present

## 2020-08-07 DIAGNOSIS — Z23 Encounter for immunization: Secondary | ICD-10-CM

## 2020-08-07 DIAGNOSIS — M5010 Cervical disc disorder with radiculopathy, unspecified cervical region: Secondary | ICD-10-CM

## 2020-08-07 DIAGNOSIS — I77810 Thoracic aortic ectasia: Secondary | ICD-10-CM

## 2020-08-07 DIAGNOSIS — I10 Essential (primary) hypertension: Secondary | ICD-10-CM | POA: Diagnosis not present

## 2020-08-07 DIAGNOSIS — G8928 Other chronic postprocedural pain: Secondary | ICD-10-CM

## 2020-08-07 DIAGNOSIS — G8929 Other chronic pain: Secondary | ICD-10-CM

## 2020-08-07 DIAGNOSIS — M542 Cervicalgia: Secondary | ICD-10-CM

## 2020-08-07 DIAGNOSIS — Z8781 Personal history of (healed) traumatic fracture: Secondary | ICD-10-CM

## 2020-08-07 DIAGNOSIS — M545 Low back pain, unspecified: Secondary | ICD-10-CM

## 2020-08-07 MED ORDER — NORTRIPTYLINE HCL 25 MG PO CAPS: 25.0000 mg | ORAL_CAPSULE | Freq: Every day | ORAL | 1 refills | Status: DC

## 2020-08-07 MED ORDER — HYDROCHLOROTHIAZIDE 25 MG PO TABS: 25.0000 mg | ORAL_TABLET | Freq: Every day | ORAL | 1 refills | Status: DC

## 2020-08-07 MED ORDER — HYDROCODONE-ACETAMINOPHEN 10-325 MG PO TABS: 1.0000 | ORAL_TABLET | Freq: Three times a day (TID) | ORAL | 0 refills | Status: DC | PRN

## 2020-08-07 MED ORDER — AMLODIPINE BESY-BENAZEPRIL HCL 10-40 MG PO CAPS
1.0000 | ORAL_CAPSULE | Freq: Every day | ORAL | 1 refills | Status: DC
Start: 2020-08-07 — End: 2020-09-24

## 2020-08-07 MED ORDER — NORTRIPTYLINE HCL 75 MG PO CAPS: 75.0000 mg | ORAL_CAPSULE | Freq: Every day | ORAL | 1 refills | Status: DC

## 2020-08-07 NOTE — Patient Instructions (Signed)
Great to see you today.  I have refilled your meds.  Chronic condition appt in 3 months and  Schedule your physical appt before end of the year and we will complete your labs
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
## 2020-08-10 LAB — DRUG MONITORING, PANEL 8 WITH CONFIRMATION, URINE
6 Acetylmorphine: NEGATIVE ng/mL (ref <10)
Alcohol Metabolites: POSITIVE ng/mL — AB
Amphetamines: NEGATIVE ng/mL (ref <500)
Benzodiazepines: NEGATIVE ng/mL (ref <100)
Buprenorphine, Urine: NEGATIVE ng/mL (ref <5)
Cocaine Metabolite: NEGATIVE ng/mL (ref <150)
Codeine: NEGATIVE ng/mL (ref <50)
Creatinine: 110.2 mg/dL
Ethyl Glucuronide (ETG): >100000 ng/mL — ABNORMAL HIGH (ref <500)
Ethyl Sulfate (ETS): 56093 ng/mL — ABNORMAL HIGH (ref <100)
Hydrocodone: 1612 ng/mL — ABNORMAL HIGH (ref <50)
Hydromorphone: 178 ng/mL — ABNORMAL HIGH (ref <50)
MDMA: NEGATIVE ng/mL (ref <500)
Marijuana Metabolite: NEGATIVE ng/mL (ref <20)
Morphine: NEGATIVE ng/mL (ref <50)
Norhydrocodone: 927 ng/mL — ABNORMAL HIGH (ref <50)
Opiates: POSITIVE ng/mL — AB (ref <100)
Oxidant: NEGATIVE ug/mL
Oxycodone: NEGATIVE ng/mL (ref <100)
pH: 5.1 (ref 4.5–9.0)

## 2020-08-10 LAB — DM TEMPLATE
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
## 2020-08-27 ENCOUNTER — Ambulatory Visit: Payer: PRIVATE HEALTH INSURANCE | Admitting: Cardiology

## 2020-09-23 ENCOUNTER — Other Ambulatory Visit: Payer: Self-pay

## 2020-09-24 ENCOUNTER — Encounter: Payer: Self-pay | Admitting: Family Medicine

## 2020-09-24 ENCOUNTER — Ambulatory Visit (INDEPENDENT_AMBULATORY_CARE_PROVIDER_SITE_OTHER): Payer: PRIVATE HEALTH INSURANCE | Admitting: Family Medicine

## 2020-09-24 VITALS — BP 139/70 | HR 81 | Temp 99.0°F | Ht 68.5 in | Wt 190.0 lb

## 2020-09-24 DIAGNOSIS — E785 Hyperlipidemia, unspecified: Secondary | ICD-10-CM

## 2020-09-24 DIAGNOSIS — Z0000 Encounter for general adult medical examination without abnormal findings: Secondary | ICD-10-CM | POA: Diagnosis not present

## 2020-09-24 DIAGNOSIS — I10 Essential (primary) hypertension: Secondary | ICD-10-CM

## 2020-09-24 DIAGNOSIS — Z125 Encounter for screening for malignant neoplasm of prostate: Secondary | ICD-10-CM

## 2020-09-24 DIAGNOSIS — Z79899 Other long term (current) drug therapy: Secondary | ICD-10-CM

## 2020-09-24 DIAGNOSIS — Z23 Encounter for immunization: Secondary | ICD-10-CM | POA: Diagnosis not present

## 2020-09-24 DIAGNOSIS — Z131 Encounter for screening for diabetes mellitus: Secondary | ICD-10-CM | POA: Diagnosis not present

## 2020-09-24 LAB — CBC WITH DIFFERENTIAL/PLATELET
Basophils Absolute: 0 10*3/uL (ref 0.0–0.1)
Basophils Relative: 0.8 % (ref 0.0–3.0)
Eosinophils Absolute: 0.3 10*3/uL (ref 0.0–0.7)
Eosinophils Relative: 4.4 % (ref 0.0–5.0)
HCT: 40.7 % (ref 39.0–52.0)
Hemoglobin: 13.8 g/dL (ref 13.0–17.0)
Lymphocytes Relative: 25.9 % (ref 12.0–46.0)
Lymphs Abs: 1.6 10*3/uL (ref 0.7–4.0)
MCHC: 33.8 g/dL (ref 30.0–36.0)
MCV: 94.4 fl (ref 78.0–100.0)
Monocytes Absolute: 0.7 10*3/uL (ref 0.1–1.0)
Monocytes Relative: 11.9 % (ref 3.0–12.0)
Neutro Abs: 3.4 10*3/uL (ref 1.4–7.7)
Neutrophils Relative %: 57 % (ref 43.0–77.0)
Platelets: 159 10*3/uL (ref 150.0–400.0)
RBC: 4.31 Mil/uL (ref 4.22–5.81)
RDW: 13.7 % (ref 11.5–15.5)
WBC: 6 10*3/uL (ref 4.0–10.5)

## 2020-09-24 LAB — COMPREHENSIVE METABOLIC PANEL
ALT: 21 U/L (ref 0–53)
AST: 27 U/L (ref 0–37)
Albumin: 4.7 g/dL (ref 3.5–5.2)
Alkaline Phosphatase: 49 U/L (ref 39–117)
BUN: 12 mg/dL (ref 6–23)
CO2: 28 mEq/L (ref 19–32)
Calcium: 9.5 mg/dL (ref 8.4–10.5)
Chloride: 103 mEq/L (ref 96–112)
Creatinine, Ser: 1.01 mg/dL (ref 0.40–1.50)
GFR: 77.26 mL/min (ref >60.00)
Glucose, Bld: 94 mg/dL (ref 70–99)
Potassium: 3.8 mEq/L (ref 3.5–5.1)
Sodium: 138 mEq/L (ref 135–145)
Total Bilirubin: 0.9 mg/dL (ref 0.2–1.2)
Total Protein: 6.9 g/dL (ref 6.0–8.3)

## 2020-09-24 LAB — LIPID PANEL
Cholesterol: 129 mg/dL (ref 0–200)
HDL: 45.3 mg/dL (ref >39.00)
LDL Cholesterol: 66 mg/dL (ref 0–99)
NonHDL: 83.58
Total CHOL/HDL Ratio: 3
Triglycerides: 90 mg/dL (ref 0.0–149.0)
VLDL: 18 mg/dL (ref 0.0–40.0)

## 2020-09-24 LAB — TSH: TSH: 1.41 u[IU]/mL (ref 0.35–4.50)

## 2020-09-24 LAB — PSA: PSA: 0.45 ng/mL (ref 0.10–4.00)

## 2020-09-24 LAB — HEMOGLOBIN A1C: Hgb A1c MFr Bld: 5.1 % (ref 4.6–6.5)

## 2020-09-24 LAB — T4, FREE: Free T4: 0.54 ng/dL — ABNORMAL LOW (ref 0.60–1.60)

## 2020-09-24 MED ORDER — HYDROCHLOROTHIAZIDE 25 MG PO TABS
25.0000 mg | ORAL_TABLET | Freq: Every day | ORAL | 1 refills | Status: DC
Start: 2020-09-24 — End: 2020-11-07

## 2020-09-24 MED ORDER — AMLODIPINE BESY-BENAZEPRIL HCL 10-40 MG PO CAPS
1.0000 | ORAL_CAPSULE | Freq: Every day | ORAL | 1 refills | Status: DC
Start: 2020-09-24 — End: 2020-11-07

## 2020-09-24 MED ORDER — LOVASTATIN 40 MG PO TABS: 80.0000 mg | ORAL_TABLET | Freq: Every day | ORAL | 3 refills | Status: DC

## 2020-09-24 NOTE — Patient Instructions (Signed)
Health Maintenance After Age 67 After age 67, you are at a higher risk for certain long-term diseases and infections as well as injuries from falls. Falls are a major cause of broken bones and head injuries in people who are older than age 67. Getting regular preventive care can help to keep you healthy and well. Preventive care includes getting regular testing and making lifestyle changes as recommended by your health care provider. Talk with your health care provider about:  Which screenings and tests you should have. A screening is a test that checks for a disease when you have no symptoms.  A diet and exercise plan that is right for you. What should I know about screenings and tests to prevent falls? Screening and testing are the best ways to find a health problem early. Early diagnosis and treatment give you the best chance of managing medical conditions that are common after age 67. Certain conditions and lifestyle choices may make you more likely to have a fall. Your health care provider may recommend:  Regular vision checks. Poor vision and conditions such as cataracts can make you more likely to have a fall. If you wear glasses, make sure to get your prescription updated if your vision changes.  Medicine review. Work with your health care provider to regularly review all of the medicines you are taking, including over-the-counter medicines. Ask your health care provider about any side effects that may make you more likely to have a fall. Tell your health care provider if any medicines that you take make you feel dizzy or sleepy.  Osteoporosis screening. Osteoporosis is a condition that causes the bones to get weaker. This can make the bones weak and cause them to break more easily.  Blood pressure screening. Blood pressure changes and medicines to control blood pressure can make you feel dizzy.  Strength and balance checks. Your health care provider may recommend certain tests to check your  strength and balance while standing, walking, or changing positions.  Foot health exam. Foot pain and numbness, as well as not wearing proper footwear, can make you more likely to have a fall.  Depression screening. You may be more likely to have a fall if you have a fear of falling, feel emotionally low, or feel unable to do activities that you used to do.  Alcohol use screening. Using too much alcohol can affect your balance and may make you more likely to have a fall. What actions can I take to lower my risk of falls? General instructions  Talk with your health care provider about your risks for falling. Tell your health care provider if: ? You fall. Be sure to tell your health care provider about all falls, even ones that seem minor. ? You feel dizzy, sleepy, or off-balance.  Take over-the-counter and prescription medicines only as told by your health care provider. These include any supplements.  Eat a healthy diet and maintain a healthy weight. A healthy diet includes low-fat dairy products, low-fat (lean) meats, and fiber from whole grains, beans, and lots of fruits and vegetables. Home safety  Remove any tripping hazards, such as rugs, cords, and clutter.  Install safety equipment such as grab bars in bathrooms and safety rails on stairs.  Keep rooms and walkways well-lit. Activity   Follow a regular exercise program to stay fit. This will help you maintain your balance. Ask your health care provider what types of exercise are appropriate for you.  If you need a cane or   walker, use it as recommended by your health care provider.  Wear supportive shoes that have nonskid soles. Lifestyle  Do not drink alcohol if your health care provider tells you not to drink.  If you drink alcohol, limit how much you have: ? 0-1 drink a day for women. ? 0-2 drinks a day for men.  Be aware of how much alcohol is in your drink. In the [place] one drink equals one typical bottle of beer (12  oz), one-half glass of wine (5 oz), or one shot of hard liquor (1 oz).  Do not use any products that contain nicotine or tobacco, such as cigarettes and e-cigarettes. If you need help quitting, ask your health care provider. Summary  Having a healthy lifestyle and getting preventive care can help to protect your health and wellness after age 67.  Screening and testing are the best way to find a health problem early and help you avoid having a fall. Early diagnosis and treatment give you the best chance for managing medical conditions that are more common for people who are older than age 67.  Falls are a major cause of broken bones and head injuries in people who are older than age 67. Take precautions to prevent a fall at home.  Work with your health care provider to learn what changes you can make to improve your health and wellness and to prevent falls. This information is not intended to replace advice given to you by your health care provider. Make sure you discuss any questions you have with your health care provider. Document Revised: 01/26/2019 Document Reviewed: 08/18/2017 Elsevier Patient Education  2020 Elsevier Inc.  
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
## 2020-11-04 ENCOUNTER — Ambulatory Visit: Payer: PRIVATE HEALTH INSURANCE | Admitting: Family Medicine

## 2020-11-07 ENCOUNTER — Other Ambulatory Visit: Payer: Self-pay

## 2020-11-07 ENCOUNTER — Ambulatory Visit: Payer: PRIVATE HEALTH INSURANCE | Admitting: Family Medicine

## 2020-11-07 ENCOUNTER — Encounter: Payer: Self-pay | Admitting: Family Medicine

## 2020-11-07 VITALS — BP 125/73 | HR 81 | Temp 98.6°F | Ht 68.5 in | Wt 182.0 lb

## 2020-11-07 DIAGNOSIS — Z79899 Other long term (current) drug therapy: Secondary | ICD-10-CM | POA: Diagnosis not present

## 2020-11-07 DIAGNOSIS — F1190 Opioid use, unspecified, uncomplicated: Secondary | ICD-10-CM

## 2020-11-07 DIAGNOSIS — G4700 Insomnia, unspecified: Secondary | ICD-10-CM

## 2020-11-07 DIAGNOSIS — M5010 Cervical disc disorder with radiculopathy, unspecified cervical region: Secondary | ICD-10-CM

## 2020-11-07 DIAGNOSIS — Z8781 Personal history of (healed) traumatic fracture: Secondary | ICD-10-CM

## 2020-11-07 DIAGNOSIS — I10 Essential (primary) hypertension: Secondary | ICD-10-CM

## 2020-11-07 DIAGNOSIS — M545 Low back pain, unspecified: Secondary | ICD-10-CM

## 2020-11-07 DIAGNOSIS — E785 Hyperlipidemia, unspecified: Secondary | ICD-10-CM

## 2020-11-07 DIAGNOSIS — G8928 Other chronic postprocedural pain: Secondary | ICD-10-CM

## 2020-11-07 DIAGNOSIS — I77810 Thoracic aortic ectasia: Secondary | ICD-10-CM

## 2020-11-07 DIAGNOSIS — M1990 Unspecified osteoarthritis, unspecified site: Secondary | ICD-10-CM

## 2020-11-07 DIAGNOSIS — M542 Cervicalgia: Secondary | ICD-10-CM

## 2020-11-07 DIAGNOSIS — G8929 Other chronic pain: Secondary | ICD-10-CM

## 2020-11-07 MED ORDER — HYDROCHLOROTHIAZIDE 25 MG PO TABS
25.0000 mg | ORAL_TABLET | Freq: Every day | ORAL | 1 refills | Status: DC
Start: 2020-11-07 — End: 2021-01-30

## 2020-11-07 MED ORDER — CYCLOBENZAPRINE HCL 10 MG PO TABS: 10.0000 mg | ORAL_TABLET | Freq: Three times a day (TID) | ORAL | 1 refills | Status: DC | PRN

## 2020-11-07 MED ORDER — HYDROCODONE-ACETAMINOPHEN 10-325 MG PO TABS: 1.0000 | ORAL_TABLET | Freq: Three times a day (TID) | ORAL | 0 refills | Status: DC | PRN

## 2020-11-07 MED ORDER — AMLODIPINE BESY-BENAZEPRIL HCL 10-40 MG PO CAPS
1.0000 | ORAL_CAPSULE | Freq: Every day | ORAL | 1 refills | Status: DC
Start: 2020-11-07 — End: 2021-01-30
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
## 2020-11-07 NOTE — Patient Instructions (Signed)
Great to see you today.  I have refilled your medications.   Next appt 3 mos.

## 2020-11-12 ENCOUNTER — Encounter: Payer: Self-pay | Admitting: Cardiology

## 2021-01-30 ENCOUNTER — Ambulatory Visit: Payer: PRIVATE HEALTH INSURANCE | Admitting: Family Medicine

## 2021-01-30 ENCOUNTER — Encounter: Payer: Self-pay | Admitting: Family Medicine

## 2021-01-30 ENCOUNTER — Other Ambulatory Visit: Payer: Self-pay

## 2021-01-30 VITALS — BP 117/71 | HR 88 | Temp 98.5°F | Ht 68.5 in | Wt 191.0 lb

## 2021-01-30 DIAGNOSIS — Z8781 Personal history of (healed) traumatic fracture: Secondary | ICD-10-CM | POA: Diagnosis not present

## 2021-01-30 DIAGNOSIS — M1990 Unspecified osteoarthritis, unspecified site: Secondary | ICD-10-CM

## 2021-01-30 DIAGNOSIS — G4700 Insomnia, unspecified: Secondary | ICD-10-CM | POA: Diagnosis not present

## 2021-01-30 DIAGNOSIS — G8929 Other chronic pain: Secondary | ICD-10-CM | POA: Diagnosis not present

## 2021-01-30 DIAGNOSIS — I10 Essential (primary) hypertension: Secondary | ICD-10-CM

## 2021-01-30 DIAGNOSIS — M545 Low back pain, unspecified: Secondary | ICD-10-CM

## 2021-01-30 DIAGNOSIS — G8928 Other chronic postprocedural pain: Secondary | ICD-10-CM

## 2021-01-30 DIAGNOSIS — Z79899 Other long term (current) drug therapy: Secondary | ICD-10-CM

## 2021-01-30 DIAGNOSIS — E785 Hyperlipidemia, unspecified: Secondary | ICD-10-CM

## 2021-01-30 DIAGNOSIS — F1190 Opioid use, unspecified, uncomplicated: Secondary | ICD-10-CM

## 2021-01-30 DIAGNOSIS — M5010 Cervical disc disorder with radiculopathy, unspecified cervical region: Secondary | ICD-10-CM

## 2021-01-30 DIAGNOSIS — M542 Cervicalgia: Secondary | ICD-10-CM

## 2021-01-30 MED ORDER — HYDROCODONE-ACETAMINOPHEN 10-325 MG PO TABS: 1.0000 | ORAL_TABLET | Freq: Three times a day (TID) | ORAL | 0 refills | Status: DC | PRN

## 2021-01-30 MED ORDER — HYDROCHLOROTHIAZIDE 25 MG PO TABS: 25.0000 mg | ORAL_TABLET | Freq: Every day | ORAL | 1 refills | Status: DC

## 2021-01-30 MED ORDER — METAXALONE 800 MG PO TABS: 800.0000 mg | ORAL_TABLET | Freq: Three times a day (TID) | ORAL | 2 refills | Status: DC

## 2021-01-30 MED ORDER — CYCLOBENZAPRINE HCL 10 MG PO TABS: 10.0000 mg | ORAL_TABLET | Freq: Three times a day (TID) | ORAL | 1 refills | Status: DC | PRN

## 2021-01-30 MED ORDER — AMLODIPINE BESY-BENAZEPRIL HCL 10-40 MG PO CAPS: 1.0000 | ORAL_CAPSULE | Freq: Every day | ORAL | 1 refills | Status: DC

## 2021-01-30 MED ORDER — DICLOFENAC SODIUM ER 100 MG PO TB24: 100.0000 mg | ORAL_TABLET | Freq: Every day | ORAL | 1 refills | Status: DC
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
## 2021-01-30 NOTE — Addendum Note (Signed)
Addended by: Felix Pacini A on: 01/30/2021 01:03 PM   Modules accepted: Orders

## 2021-01-30 NOTE — Patient Instructions (Signed)
    Your blood pressure looks great.  We added a different muscle relaxer called skelaxin- try this one- just dont mix with flexeril.  Added diclofenac- antiinflammatory. Take once a day with food.

## 2021-02-03 ENCOUNTER — Ambulatory Visit: Payer: Medicare Other

## 2021-04-24 ENCOUNTER — Ambulatory Visit: Payer: Medicare Other | Admitting: Family Medicine

## 2021-05-02 ENCOUNTER — Ambulatory Visit (INDEPENDENT_AMBULATORY_CARE_PROVIDER_SITE_OTHER): Payer: Medicare Other | Admitting: Family Medicine

## 2021-05-02 ENCOUNTER — Other Ambulatory Visit: Payer: Self-pay

## 2021-05-02 ENCOUNTER — Encounter: Payer: Self-pay | Admitting: Family Medicine

## 2021-05-02 DIAGNOSIS — E785 Hyperlipidemia, unspecified: Secondary | ICD-10-CM

## 2021-05-02 DIAGNOSIS — M545 Low back pain, unspecified: Secondary | ICD-10-CM | POA: Diagnosis not present

## 2021-05-02 DIAGNOSIS — G8929 Other chronic pain: Secondary | ICD-10-CM

## 2021-05-02 DIAGNOSIS — G8928 Other chronic postprocedural pain: Secondary | ICD-10-CM

## 2021-05-02 DIAGNOSIS — M1990 Unspecified osteoarthritis, unspecified site: Secondary | ICD-10-CM

## 2021-05-02 DIAGNOSIS — M5010 Cervical disc disorder with radiculopathy, unspecified cervical region: Secondary | ICD-10-CM

## 2021-05-02 DIAGNOSIS — F1190 Opioid use, unspecified, uncomplicated: Secondary | ICD-10-CM

## 2021-05-02 DIAGNOSIS — Z23 Encounter for immunization: Secondary | ICD-10-CM | POA: Diagnosis not present

## 2021-05-02 MED ORDER — LOVASTATIN 40 MG PO TABS: 80.0000 mg | ORAL_TABLET | Freq: Every day | ORAL | 3 refills | Status: DC

## 2021-05-02 MED ORDER — METAXALONE 800 MG PO TABS: 800.0000 mg | ORAL_TABLET | Freq: Three times a day (TID) | ORAL | 2 refills | Status: DC

## 2021-05-02 MED ORDER — HYDROCHLOROTHIAZIDE 25 MG PO TABS: 25.0000 mg | ORAL_TABLET | Freq: Every day | ORAL | 1 refills | Status: DC

## 2021-05-02 MED ORDER — HYDROCODONE-ACETAMINOPHEN 10-325 MG PO TABS: 1.0000 | ORAL_TABLET | Freq: Three times a day (TID) | ORAL | 0 refills | Status: DC | PRN

## 2021-05-02 MED ORDER — AMLODIPINE BESY-BENAZEPRIL HCL 10-40 MG PO CAPS: 1.0000 | ORAL_CAPSULE | Freq: Every day | ORAL | 1 refills | Status: DC

## 2021-05-02 MED ORDER — DICLOFENAC SODIUM ER 100 MG PO TB24: 100.0000 mg | ORAL_TABLET | Freq: Every day | ORAL | 1 refills | Status: DC
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
## 2021-05-02 NOTE — Patient Instructions (Signed)
Great to see you today. I have refilled your medications (except flexeril).    Remember to use th diclofenac and skelaxin.   See you in 3 months.

## 2021-05-09 ENCOUNTER — Encounter: Payer: Self-pay | Admitting: Family Medicine

## 2021-07-21 ENCOUNTER — Ambulatory Visit (INDEPENDENT_AMBULATORY_CARE_PROVIDER_SITE_OTHER): Payer: Medicare Other | Admitting: Family Medicine

## 2021-07-21 ENCOUNTER — Encounter: Payer: Self-pay | Admitting: Family Medicine

## 2021-07-21 ENCOUNTER — Other Ambulatory Visit: Payer: Self-pay

## 2021-07-21 VITALS — BP 125/72 | HR 72 | Temp 98.2°F | Ht 69.0 in | Wt 190.0 lb

## 2021-07-21 DIAGNOSIS — M1990 Unspecified osteoarthritis, unspecified site: Secondary | ICD-10-CM | POA: Diagnosis not present

## 2021-07-21 DIAGNOSIS — I10 Essential (primary) hypertension: Secondary | ICD-10-CM | POA: Diagnosis not present

## 2021-07-21 DIAGNOSIS — G8928 Other chronic postprocedural pain: Secondary | ICD-10-CM | POA: Diagnosis not present

## 2021-07-21 DIAGNOSIS — G8929 Other chronic pain: Secondary | ICD-10-CM | POA: Diagnosis not present

## 2021-07-21 DIAGNOSIS — Z23 Encounter for immunization: Secondary | ICD-10-CM | POA: Diagnosis not present

## 2021-07-21 DIAGNOSIS — E785 Hyperlipidemia, unspecified: Secondary | ICD-10-CM | POA: Diagnosis not present

## 2021-07-21 DIAGNOSIS — E782 Mixed hyperlipidemia: Secondary | ICD-10-CM | POA: Diagnosis not present

## 2021-07-21 DIAGNOSIS — M545 Low back pain, unspecified: Secondary | ICD-10-CM | POA: Diagnosis not present

## 2021-07-21 DIAGNOSIS — F1190 Opioid use, unspecified, uncomplicated: Secondary | ICD-10-CM

## 2021-07-21 DIAGNOSIS — M5010 Cervical disc disorder with radiculopathy, unspecified cervical region: Secondary | ICD-10-CM

## 2021-07-21 LAB — COMPREHENSIVE METABOLIC PANEL
ALT: 21 U/L (ref 0–53)
AST: 34 U/L (ref 0–37)
Albumin: 4.6 g/dL (ref 3.5–5.2)
Alkaline Phosphatase: 51 U/L (ref 39–117)
BUN: 9 mg/dL (ref 6–23)
CO2: 29 mEq/L (ref 19–32)
Calcium: 9.7 mg/dL (ref 8.4–10.5)
Chloride: 101 mEq/L (ref 96–112)
Creatinine, Ser: 0.81 mg/dL (ref 0.40–1.50)
GFR: 91.06 mL/min (ref >60.00)
Glucose, Bld: 91 mg/dL (ref 70–99)
Potassium: 4.7 mEq/L (ref 3.5–5.1)
Sodium: 142 mEq/L (ref 135–145)
Total Bilirubin: 1 mg/dL (ref 0.2–1.2)
Total Protein: 7 g/dL (ref 6.0–8.3)

## 2021-07-21 LAB — CBC WITH DIFFERENTIAL/PLATELET
Basophils Absolute: 0.1 10*3/uL (ref 0.0–0.1)
Basophils Relative: 0.9 % (ref 0.0–3.0)
Eosinophils Absolute: 0.3 10*3/uL (ref 0.0–0.7)
Eosinophils Relative: 4.2 % (ref 0.0–5.0)
HCT: 42.6 % (ref 39.0–52.0)
Hemoglobin: 14.3 g/dL (ref 13.0–17.0)
Lymphocytes Relative: 20.5 % (ref 12.0–46.0)
Lymphs Abs: 1.5 10*3/uL (ref 0.7–4.0)
MCHC: 33.6 g/dL (ref 30.0–36.0)
MCV: 94.8 fl (ref 78.0–100.0)
Monocytes Absolute: 0.8 10*3/uL (ref 0.1–1.0)
Monocytes Relative: 11.7 % (ref 3.0–12.0)
Neutro Abs: 4.5 10*3/uL (ref 1.4–7.7)
Neutrophils Relative %: 62.7 % (ref 43.0–77.0)
Platelets: 174 10*3/uL (ref 150.0–400.0)
RBC: 4.49 Mil/uL (ref 4.22–5.81)
RDW: 13.3 % (ref 11.5–15.5)
WBC: 7.1 10*3/uL (ref 4.0–10.5)

## 2021-07-21 LAB — LIPID PANEL
Cholesterol: 155 mg/dL (ref 0–200)
HDL: 64.9 mg/dL (ref >39.00)
LDL Cholesterol: 61 mg/dL (ref 0–99)
NonHDL: 90.24
Total CHOL/HDL Ratio: 2
Triglycerides: 144 mg/dL (ref 0.0–149.0)
VLDL: 28.8 mg/dL (ref 0.0–40.0)

## 2021-07-21 LAB — TSH: TSH: 1 u[IU]/mL (ref 0.35–5.50)

## 2021-07-21 MED ORDER — HYDROCODONE-ACETAMINOPHEN 10-325 MG PO TABS: 1.0000 | ORAL_TABLET | Freq: Three times a day (TID) | ORAL | 0 refills | Status: DC | PRN

## 2021-07-21 MED ORDER — DICLOFENAC SODIUM ER 100 MG PO TB24: 100.0000 mg | ORAL_TABLET | Freq: Every day | ORAL | 1 refills | Status: DC

## 2021-07-21 MED ORDER — HYDROCHLOROTHIAZIDE 25 MG PO TABS: 25.0000 mg | ORAL_TABLET | Freq: Every day | ORAL | 1 refills | Status: DC

## 2021-07-21 MED ORDER — METAXALONE 800 MG PO TABS: 800.0000 mg | ORAL_TABLET | Freq: Three times a day (TID) | ORAL | 2 refills | Status: DC

## 2021-07-21 MED ORDER — PREGABALIN 75 MG PO CAPS: 75.0000 mg | ORAL_CAPSULE | Freq: Two times a day (BID) | ORAL | 0 refills | Status: DC

## 2021-07-21 MED ORDER — AMLODIPINE BESY-BENAZEPRIL HCL 10-40 MG PO CAPS
1.0000 | ORAL_CAPSULE | Freq: Every day | ORAL | 1 refills | Status: DC
Start: 2021-07-21 — End: 2021-09-30

## 2021-07-21 MED ORDER — LOVASTATIN 40 MG PO TABS: 80.0000 mg | ORAL_TABLET | Freq: Every day | ORAL | 3 refills | Status: DC

## 2021-07-21 NOTE — Patient Instructions (Signed)
Great to see you today.  I have refilled the medication(s) we provide.   If labs were collected, we will inform you of lab results once received either by echart message or telephone call.   - echart message- for normal results that have been seen by the patient already.   - telephone call: abnormal results or if patient has not viewed results in their echart.  
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
## 2021-07-23 LAB — DRUG MONITORING PANEL 376104, URINE
Amphetamines: NEGATIVE ng/mL (ref <500)
Barbiturates: NEGATIVE ng/mL (ref <300)
Benzodiazepines: NEGATIVE ng/mL (ref <100)
Cocaine Metabolite: NEGATIVE ng/mL (ref <150)
Codeine: NEGATIVE ng/mL (ref <50)
Desmethyltramadol: NEGATIVE ng/mL (ref <100)
Hydrocodone: 779 ng/mL — ABNORMAL HIGH (ref <50)
Hydromorphone: 231 ng/mL — ABNORMAL HIGH (ref <50)
Morphine: NEGATIVE ng/mL (ref <50)
Norhydrocodone: 884 ng/mL — ABNORMAL HIGH (ref <50)
Opiates: POSITIVE ng/mL — AB (ref <100)
Oxycodone: NEGATIVE ng/mL (ref <100)
Tramadol: NEGATIVE ng/mL (ref <100)

## 2021-07-23 LAB — DM TEMPLATE

## 2021-09-30 ENCOUNTER — Ambulatory Visit (INDEPENDENT_AMBULATORY_CARE_PROVIDER_SITE_OTHER): Payer: Medicare Other | Admitting: Family Medicine

## 2021-09-30 ENCOUNTER — Encounter: Payer: Self-pay | Admitting: Family Medicine

## 2021-09-30 ENCOUNTER — Other Ambulatory Visit: Payer: Self-pay

## 2021-09-30 VITALS — BP 107/66 | HR 66 | Temp 98.1°F | Ht 69.0 in | Wt 194.0 lb

## 2021-09-30 DIAGNOSIS — Z23 Encounter for immunization: Secondary | ICD-10-CM | POA: Diagnosis not present

## 2021-09-30 DIAGNOSIS — G8928 Other chronic postprocedural pain: Secondary | ICD-10-CM | POA: Diagnosis not present

## 2021-09-30 DIAGNOSIS — I10 Essential (primary) hypertension: Secondary | ICD-10-CM | POA: Diagnosis not present

## 2021-09-30 DIAGNOSIS — E782 Mixed hyperlipidemia: Secondary | ICD-10-CM | POA: Diagnosis not present

## 2021-09-30 DIAGNOSIS — E785 Hyperlipidemia, unspecified: Secondary | ICD-10-CM | POA: Diagnosis not present

## 2021-09-30 DIAGNOSIS — M5010 Cervical disc disorder with radiculopathy, unspecified cervical region: Secondary | ICD-10-CM | POA: Diagnosis not present

## 2021-09-30 DIAGNOSIS — Z79899 Other long term (current) drug therapy: Secondary | ICD-10-CM

## 2021-09-30 DIAGNOSIS — Z8781 Personal history of (healed) traumatic fracture: Secondary | ICD-10-CM | POA: Diagnosis not present

## 2021-09-30 DIAGNOSIS — G8929 Other chronic pain: Secondary | ICD-10-CM

## 2021-09-30 DIAGNOSIS — M542 Cervicalgia: Secondary | ICD-10-CM

## 2021-09-30 DIAGNOSIS — M545 Low back pain, unspecified: Secondary | ICD-10-CM

## 2021-09-30 DIAGNOSIS — M1990 Unspecified osteoarthritis, unspecified site: Secondary | ICD-10-CM | POA: Diagnosis not present

## 2021-09-30 DIAGNOSIS — F1190 Opioid use, unspecified, uncomplicated: Secondary | ICD-10-CM

## 2021-09-30 MED ORDER — HYDROCODONE-ACETAMINOPHEN 10-325 MG PO TABS: 1.0000 | ORAL_TABLET | Freq: Three times a day (TID) | ORAL | 0 refills | Status: DC | PRN

## 2021-09-30 MED ORDER — HYDROCHLOROTHIAZIDE 25 MG PO TABS: 25.0000 mg | ORAL_TABLET | Freq: Every day | ORAL | 1 refills | Status: DC

## 2021-09-30 MED ORDER — METAXALONE 800 MG PO TABS: 800.0000 mg | ORAL_TABLET | Freq: Three times a day (TID) | ORAL | 2 refills | Status: DC

## 2021-09-30 MED ORDER — LOVASTATIN 40 MG PO TABS
80.0000 mg | ORAL_TABLET | Freq: Every day | ORAL | 3 refills | Status: DC
Start: 2021-09-30 — End: 2021-12-09

## 2021-09-30 MED ORDER — PREGABALIN 150 MG PO CAPS: 150.0000 mg | ORAL_CAPSULE | Freq: Two times a day (BID) | ORAL | 1 refills | Status: DC

## 2021-09-30 MED ORDER — AMLODIPINE BESY-BENAZEPRIL HCL 10-40 MG PO CAPS: 1.0000 | ORAL_CAPSULE | Freq: Every day | ORAL | 1 refills | Status: DC
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
## 2021-09-30 NOTE — Patient Instructions (Signed)
Great to see you today.  I have refilled the medication(s) we provide.   If labs were collected, we will inform you of lab results once received either by echart message or telephone call.   - echart message- for normal results that have been seen by the patient already.   - telephone call: abnormal results or if patient has not viewed results in their echart.  

## 2021-12-09 ENCOUNTER — Other Ambulatory Visit: Payer: Self-pay

## 2021-12-09 ENCOUNTER — Encounter: Payer: Self-pay | Admitting: Family Medicine

## 2021-12-09 ENCOUNTER — Ambulatory Visit (INDEPENDENT_AMBULATORY_CARE_PROVIDER_SITE_OTHER): Payer: Medicare Other | Admitting: Family Medicine

## 2021-12-09 VITALS — BP 103/64 | HR 80 | Temp 98.5°F | Ht 69.29 in | Wt 193.0 lb

## 2021-12-09 DIAGNOSIS — M542 Cervicalgia: Secondary | ICD-10-CM | POA: Diagnosis not present

## 2021-12-09 DIAGNOSIS — Z79899 Other long term (current) drug therapy: Secondary | ICD-10-CM

## 2021-12-09 DIAGNOSIS — E785 Hyperlipidemia, unspecified: Secondary | ICD-10-CM

## 2021-12-09 DIAGNOSIS — M545 Low back pain, unspecified: Secondary | ICD-10-CM

## 2021-12-09 DIAGNOSIS — I2584 Coronary atherosclerosis due to calcified coronary lesion: Secondary | ICD-10-CM

## 2021-12-09 DIAGNOSIS — E782 Mixed hyperlipidemia: Secondary | ICD-10-CM

## 2021-12-09 DIAGNOSIS — I2510 Atherosclerotic heart disease of native coronary artery without angina pectoris: Secondary | ICD-10-CM

## 2021-12-09 DIAGNOSIS — Z125 Encounter for screening for malignant neoplasm of prostate: Secondary | ICD-10-CM | POA: Diagnosis not present

## 2021-12-09 DIAGNOSIS — F5101 Primary insomnia: Secondary | ICD-10-CM

## 2021-12-09 DIAGNOSIS — Z131 Encounter for screening for diabetes mellitus: Secondary | ICD-10-CM | POA: Diagnosis not present

## 2021-12-09 DIAGNOSIS — G8928 Other chronic postprocedural pain: Secondary | ICD-10-CM

## 2021-12-09 DIAGNOSIS — M5010 Cervical disc disorder with radiculopathy, unspecified cervical region: Secondary | ICD-10-CM

## 2021-12-09 DIAGNOSIS — Z8781 Personal history of (healed) traumatic fracture: Secondary | ICD-10-CM

## 2021-12-09 DIAGNOSIS — I10 Essential (primary) hypertension: Secondary | ICD-10-CM | POA: Diagnosis not present

## 2021-12-09 DIAGNOSIS — G8929 Other chronic pain: Secondary | ICD-10-CM

## 2021-12-09 DIAGNOSIS — F1190 Opioid use, unspecified, uncomplicated: Secondary | ICD-10-CM | POA: Diagnosis not present

## 2021-12-09 DIAGNOSIS — R7309 Other abnormal glucose: Secondary | ICD-10-CM | POA: Diagnosis not present

## 2021-12-09 DIAGNOSIS — I77810 Thoracic aortic ectasia: Secondary | ICD-10-CM

## 2021-12-09 DIAGNOSIS — M1990 Unspecified osteoarthritis, unspecified site: Secondary | ICD-10-CM | POA: Diagnosis not present

## 2021-12-09 DIAGNOSIS — Z0000 Encounter for general adult medical examination without abnormal findings: Secondary | ICD-10-CM

## 2021-12-09 LAB — PSA, MEDICARE: PSA: 0.39 ng/ml (ref 0.10–4.00)

## 2021-12-09 LAB — HEMOGLOBIN A1C: Hgb A1c MFr Bld: 5.4 % (ref 4.6–6.5)

## 2021-12-09 MED ORDER — HYDROCODONE-ACETAMINOPHEN 10-325 MG PO TABS: 1.0000 | ORAL_TABLET | Freq: Three times a day (TID) | ORAL | 0 refills | Status: DC | PRN

## 2021-12-09 MED ORDER — LOVASTATIN 40 MG PO TABS: 80.0000 mg | ORAL_TABLET | Freq: Every day | ORAL | 3 refills | Status: DC

## 2021-12-09 MED ORDER — AMLODIPINE BESY-BENAZEPRIL HCL 10-40 MG PO CAPS: 1.0000 | ORAL_CAPSULE | Freq: Every day | ORAL | 1 refills | Status: DC

## 2021-12-09 MED ORDER — PREGABALIN 150 MG PO CAPS: 150.0000 mg | ORAL_CAPSULE | Freq: Two times a day (BID) | ORAL | 1 refills | Status: DC

## 2021-12-09 MED ORDER — DICLOFENAC SODIUM ER 100 MG PO TB24
100.0000 mg | ORAL_TABLET | Freq: Every day | ORAL | 1 refills | Status: DC
Start: 2021-12-09 — End: 2022-03-17

## 2021-12-09 MED ORDER — METAXALONE 800 MG PO TABS: 800.0000 mg | ORAL_TABLET | Freq: Three times a day (TID) | ORAL | 2 refills | Status: DC

## 2021-12-09 NOTE — Patient Instructions (Signed)
Health Maintenance After Age 69 After age 69, you are at a higher risk for certain long-term diseases and infections as well as injuries from falls. Falls are a major cause of broken bones and head injuries in people who are older than age 69. Getting regular preventive care can help to keep you healthy and well. Preventive care includes getting regular testing and making lifestyle changes as recommended by your health care provider. Talk with your health care provider about: Which screenings and tests you should have. A screening is a test that checks for a disease when you have no symptoms. A diet and exercise plan that is right for you. What should I know about screenings and tests to prevent falls? Screening and testing are the best ways to find a health problem early. Early diagnosis and treatment give you the best chance of managing medical conditions that are common after age 69. Certain conditions and lifestyle choices may make you more likely to have a fall. Your health care provider may recommend: Regular vision checks. Poor vision and conditions such as cataracts can make you more likely to have a fall. If you wear glasses, make sure to get your prescription updated if your vision changes. Medicine review. Work with your health care provider to regularly review all of the medicines you are taking, including over-the-counter medicines. Ask your health care provider about any side effects that may make you more likely to have a fall. Tell your health care provider if any medicines that you take make you feel dizzy or sleepy. Strength and balance checks. Your health care provider may recommend certain tests to check your strength and balance while standing, walking, or changing positions. Foot health exam. Foot pain and numbness, as well as not wearing proper footwear, can make you more likely to have a fall. Screenings, including: Osteoporosis screening. Osteoporosis is a condition that causes  the bones to get weaker and break more easily. Blood pressure screening. Blood pressure changes and medicines to control blood pressure can make you feel dizzy. Depression screening. You may be more likely to have a fall if you have a fear of falling, feel depressed, or feel unable to do activities that you used to do. Alcohol use screening. Using too much alcohol can affect your balance and may make you more likely to have a fall. Follow these instructions at home: Lifestyle Do not drink alcohol if: Your health care provider tells you not to drink. If you drink alcohol: Limit how much you have to: 0-1 drink a day for women. 0-2 drinks a day for men. Know how much alcohol is in your drink. In the [place] one drink equals one 12 oz bottle of beer (355 mL), one 5 oz glass of wine (148 mL), or one 1 oz glass of hard liquor (44 mL). Do not use any products that contain nicotine or tobacco. These products include cigarettes, chewing tobacco, and vaping devices, such as e-cigarettes. If you need help quitting, ask your health care provider. Activity  Follow a regular exercise program to stay fit. This will help you maintain your balance. Ask your health care provider what types of exercise are appropriate for you. If you need a cane or walker, use it as recommended by your health care provider. Wear supportive shoes that have nonskid soles. Safety  Remove any tripping hazards, such as rugs, cords, and clutter. Install safety equipment such as grab bars in bathrooms and safety rails on stairs. Keep rooms and walkways   well-lit. General instructions Talk with your health care provider about your risks for falling. Tell your health care provider if: You fall. Be sure to tell your health care provider about all falls, even ones that seem minor. You feel dizzy, tiredness (fatigue), or off-balance. Take over-the-counter and prescription medicines only as told by your health care provider. These include  supplements. Eat a healthy diet and maintain a healthy weight. A healthy diet includes low-fat dairy products, low-fat (lean) meats, and fiber from whole grains, beans, and lots of fruits and vegetables. Stay current with your vaccines. Schedule regular health, dental, and eye exams. Summary Having a healthy lifestyle and getting preventive care can help to protect your health and wellness after age 69. Screening and testing are the best way to find a health problem early and help you avoid having a fall. Early diagnosis and treatment give you the best chance for managing medical conditions that are more common for people who are older than age 69. Falls are a major cause of broken bones and head injuries in people who are older than age 69. Take precautions to prevent a fall at home. Work with your health care provider to learn what changes you can make to improve your health and wellness and to prevent falls. This information is not intended to replace advice given to you by your health care provider. Make sure you discuss any questions you have with your health care provider. Document Revised: 02/24/2021 Document Reviewed: 02/24/2021 [employer] Patient Education  2022 [employer]  
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
## 2021-12-09 NOTE — Addendum Note (Signed)
Addended by: Felix Pacini A on: 12/09/2021 01:42 PM   Modules accepted: Orders

## 2021-12-17 ENCOUNTER — Encounter (HOSPITAL_COMMUNITY): Payer: Self-pay

## 2021-12-31 ENCOUNTER — Encounter (HOSPITAL_BASED_OUTPATIENT_CLINIC_OR_DEPARTMENT_OTHER): Payer: Self-pay

## 2021-12-31 ENCOUNTER — Ambulatory Visit (HOSPITAL_BASED_OUTPATIENT_CLINIC_OR_DEPARTMENT_OTHER)
Admission: RE | Admit: 2021-12-31 | Discharge: 2021-12-31 | Disposition: A | Discharge status: Home / Self Care | Payer: Medicare Other | Source: Ambulatory Visit | Attending: Family Medicine | Admitting: Family Medicine

## 2021-12-31 ENCOUNTER — Other Ambulatory Visit: Payer: Self-pay

## 2021-12-31 DIAGNOSIS — E782 Mixed hyperlipidemia: Secondary | ICD-10-CM | POA: Insufficient documentation

## 2021-12-31 DIAGNOSIS — I2584 Coronary atherosclerosis due to calcified coronary lesion: Secondary | ICD-10-CM | POA: Insufficient documentation

## 2021-12-31 DIAGNOSIS — I2510 Atherosclerotic heart disease of native coronary artery without angina pectoris: Secondary | ICD-10-CM | POA: Insufficient documentation

## 2021-12-31 DIAGNOSIS — I10 Essential (primary) hypertension: Secondary | ICD-10-CM | POA: Insufficient documentation

## 2022-01-01 ENCOUNTER — Telehealth: Payer: Self-pay | Admitting: Family Medicine

## 2022-01-01 DIAGNOSIS — R931 Abnormal findings on diagnostic imaging of heart and coronary circulation: Secondary | ICD-10-CM | POA: Insufficient documentation

## 2022-01-01 NOTE — Telephone Encounter (Signed)
Patient and discussed findings below.  Emergent precautions were discussed with him as well.  He tolerated atorvastatin in the past, he was switched to lovastatin 80 mg secondary to formulary only.  His last LDL was at goal less than 70.  ? ?We will send results to his cardiology team and request ASAP appointment for them to further evaluate. ? ?His coronary Calcium Score: ?Left main: 177 ?Left anterior descending artery: 478 ?Left circumflex artery: 0> normal ?Right coronary artery: 70.2 ?Total: 725> cardiology referral as ASAP for further studies ?Percentile: 82 ?  ?Ascending Aorta: Mildly dilated aortic root (40 mm); aortic ?Atherosclerosis> this is stable from prior image.  ?  ?
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
## 2022-01-08 ENCOUNTER — Other Ambulatory Visit: Payer: Self-pay

## 2022-01-08 ENCOUNTER — Encounter: Payer: Self-pay | Admitting: Cardiology

## 2022-01-08 ENCOUNTER — Telehealth: Payer: Self-pay

## 2022-01-08 ENCOUNTER — Ambulatory Visit: Payer: Medicare Other | Admitting: Cardiology

## 2022-01-08 VITALS — BP 102/60 | HR 82 | Ht 70.0 in | Wt 195.0 lb

## 2022-01-08 DIAGNOSIS — I77819 Aortic ectasia, unspecified site: Secondary | ICD-10-CM | POA: Diagnosis not present

## 2022-01-08 DIAGNOSIS — E785 Hyperlipidemia, unspecified: Secondary | ICD-10-CM | POA: Diagnosis not present

## 2022-01-08 DIAGNOSIS — I10 Essential (primary) hypertension: Secondary | ICD-10-CM | POA: Diagnosis not present

## 2022-01-08 DIAGNOSIS — R079 Chest pain, unspecified: Secondary | ICD-10-CM

## 2022-01-08 NOTE — Patient Instructions (Signed)
Medication Instructions:  ?Your physician recommends that you continue on your current medications as directed. Please refer to the Current Medication list given to you today. ? ?*If you need a refill on your cardiac medications before your next appointment, please call your pharmacy* ? ?Testing/Procedures: ?Your physician has requested that you have an exercise stress myoview. For further information please visit [email or web address] Please follow instruction sheet, as given. ? ?Follow-Up: ?At [facility], you and your health needs are our priority.  As part of our continuing mission to provide you with exceptional heart care, we have created designated Provider Care Teams.  These Care Teams include your primary Cardiologist (physician) and Advanced Practice Providers (APPs -  Physician Assistants and Nurse Practitioners) who all work together to provide you with the care you need, when you need it. ? ?We recommend signing up for the patient portal called "MyChart".  Sign up information is provided on this After Visit Summary.  MyChart is used to connect with patients for Virtual Visits (Telemedicine).  Patients are able to view lab/test results, encounter notes, upcoming appointments, etc.  Non-urgent messages can be sent to your provider as well.   ?To learn more about what you can do with MyChart, go to [email or web address].   ? ?Your next appointment:   ?6 month(s) ? ?The format for your next appointment:   ?In Person ? ?Provider:   ?Dr. Bjorn Pippin ? ?

## 2022-01-08 NOTE — Telephone Encounter (Signed)
Called pt to schedule AWV. Please schedule with health coach, Toria or Chelsia Serres. ? ?

## 2022-01-13 ENCOUNTER — Telehealth (HOSPITAL_COMMUNITY): Payer: Self-pay

## 2022-01-13 NOTE — Telephone Encounter (Signed)
Detailed instructions left on the patient's answering machine. Asked to call back with any questions. S.Zenda Herskowitz EMTP 

## 2022-01-15 ENCOUNTER — Ambulatory Visit (HOSPITAL_COMMUNITY): Payer: Medicare Other | Attending: Cardiology

## 2022-01-15 DIAGNOSIS — R079 Chest pain, unspecified: Secondary | ICD-10-CM | POA: Insufficient documentation

## 2022-01-15 LAB — MYOCARDIAL PERFUSION IMAGING
Angina Index: 0
Base ST Depression (mm): 0 mm
Duke Treadmill Score: 5
Estimated workload: 6.4
Exercise duration (min): 4 min
Exercise duration (sec): 30 s
LV dias vol: 73 mL (ref 62–150)
LV sys vol: 20 mL
MPHR: 152 {beats}/min
Nuc Stress EF: 73 %
Peak HR: 141 {beats}/min
Percent HR: 92 %
RPE: 19
Rest HR: 68 {beats}/min
Rest Nuclear Isotope Dose: 9.6 mCi
SDS: 2
SRS: 0
SSS: 2
ST Depression (mm): 0 mm
Stress Nuclear Isotope Dose: 30.9 mCi
TID: 0.92

## 2022-01-15 MED ORDER — TECHNETIUM TC 99M TETROFOSMIN IV KIT
9.6000 | PACK | Freq: Once | INTRAVENOUS | Status: AC | PRN
  Administered 2022-01-15: 9.6 via INTRAVENOUS
  Filled 2022-01-15: qty 10

## 2022-01-15 MED ORDER — TECHNETIUM TC 99M TETROFOSMIN IV KIT
30.9000 | PACK | Freq: Once | INTRAVENOUS | Status: AC | PRN
  Administered 2022-01-15: 30.9 via INTRAVENOUS
  Filled 2022-01-15: qty 31

## 2022-03-17 ENCOUNTER — Ambulatory Visit (INDEPENDENT_AMBULATORY_CARE_PROVIDER_SITE_OTHER): Payer: Medicare Other | Admitting: Family Medicine

## 2022-03-17 ENCOUNTER — Encounter: Payer: Self-pay | Admitting: Family Medicine

## 2022-03-17 VITALS — BP 122/74 | HR 69 | Temp 98.6°F | Wt 190.8 lb

## 2022-03-17 DIAGNOSIS — M1990 Unspecified osteoarthritis, unspecified site: Secondary | ICD-10-CM

## 2022-03-17 DIAGNOSIS — F5101 Primary insomnia: Secondary | ICD-10-CM

## 2022-03-17 DIAGNOSIS — Z8781 Personal history of (healed) traumatic fracture: Secondary | ICD-10-CM | POA: Diagnosis not present

## 2022-03-17 DIAGNOSIS — G8929 Other chronic pain: Secondary | ICD-10-CM | POA: Diagnosis not present

## 2022-03-17 DIAGNOSIS — E785 Hyperlipidemia, unspecified: Secondary | ICD-10-CM

## 2022-03-17 DIAGNOSIS — Z79899 Other long term (current) drug therapy: Secondary | ICD-10-CM | POA: Diagnosis not present

## 2022-03-17 DIAGNOSIS — M5010 Cervical disc disorder with radiculopathy, unspecified cervical region: Secondary | ICD-10-CM | POA: Diagnosis not present

## 2022-03-17 DIAGNOSIS — F1190 Opioid use, unspecified, uncomplicated: Secondary | ICD-10-CM | POA: Diagnosis not present

## 2022-03-17 DIAGNOSIS — E782 Mixed hyperlipidemia: Secondary | ICD-10-CM

## 2022-03-17 DIAGNOSIS — I10 Essential (primary) hypertension: Secondary | ICD-10-CM

## 2022-03-17 DIAGNOSIS — G8928 Other chronic postprocedural pain: Secondary | ICD-10-CM

## 2022-03-17 DIAGNOSIS — M545 Low back pain, unspecified: Secondary | ICD-10-CM

## 2022-03-17 LAB — BASIC METABOLIC PANEL
BUN: 8 mg/dL (ref 6–23)
CO2: 28 mEq/L (ref 19–32)
Calcium: 9.3 mg/dL (ref 8.4–10.5)
Chloride: 97 mEq/L (ref 96–112)
Creatinine, Ser: 0.83 mg/dL (ref 0.40–1.50)
GFR: 89.98 mL/min (ref >60.00)
Glucose, Bld: 95 mg/dL (ref 70–99)
Potassium: 3.7 mEq/L (ref 3.5–5.1)
Sodium: 135 mEq/L (ref 135–145)

## 2022-03-17 MED ORDER — DICLOFENAC SODIUM ER 100 MG PO TB24
100.0000 mg | ORAL_TABLET | Freq: Every day | ORAL | 1 refills | Status: DC
Start: 2022-03-17 — End: 2022-06-15

## 2022-03-17 MED ORDER — HYDROCODONE-ACETAMINOPHEN 10-325 MG PO TABS: 1.0000 | ORAL_TABLET | Freq: Three times a day (TID) | ORAL | 0 refills | Status: DC | PRN

## 2022-03-17 MED ORDER — LOVASTATIN 40 MG PO TABS: 80.0000 mg | ORAL_TABLET | Freq: Every day | ORAL | 3 refills | Status: DC

## 2022-03-17 MED ORDER — HYDROCHLOROTHIAZIDE 25 MG PO TABS: 25.0000 mg | ORAL_TABLET | Freq: Every day | ORAL | 1 refills | Status: DC

## 2022-03-17 MED ORDER — BENAZEPRIL HCL 40 MG PO TABS: 40.0000 mg | ORAL_TABLET | Freq: Every day | ORAL | 3 refills | Status: DC

## 2022-03-17 MED ORDER — PREGABALIN 150 MG PO CAPS: 150.0000 mg | ORAL_CAPSULE | Freq: Two times a day (BID) | ORAL | 1 refills | Status: DC

## 2022-03-17 MED ORDER — METAXALONE 800 MG PO TABS: 800.0000 mg | ORAL_TABLET | Freq: Three times a day (TID) | ORAL | 2 refills | Status: DC
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
## 2022-03-17 NOTE — Patient Instructions (Signed)
No follow-ups on file.        Great to see you today.  I have refilled the medication(s) we provide.   If labs were collected, we will inform you of lab results once received either by echart message or telephone call.   - echart message- for normal results that have been seen by the patient already.   - telephone call: abnormal results or if patient has not viewed results in their echart.  

## 2022-03-18 NOTE — Progress Notes (Signed)
This encounter was created in error - please disregard.

## 2022-04-11 IMAGING — CT CT CARDIAC CORONARY ARTERY CALCIUM SCORE
3 series · 14 of 20 positions shown, 16 images · non-contrast
Comparison: CT a of the chest on 12/20/2019
COMPARISON: CT a of the chest on 12/20/2019

Addendum:
EXAM:
OVER-READ INTERPRETATION  CT CHEST

The following report is an over-read performed by radiologist Dr.
Castellani Gonzaga [REDACTED] on 12/31/2021. This
over-read does not include interpretation of cardiac or coronary
anatomy or pathology. The coronary calcium score interpretation by
the cardiologist is attached.
CLINICAL DATA: Cardiovascular Disease Risk stratification
Coronary Calcium Score
TECHNIQUE: A gated, non-contrast computed tomography scan of the heart was
performed using 3mm slice thickness. Axial images were analyzed on a
dedicated workstation. Calcium scoring of the coronary arteries was
performed using the Agatston method.

[Series 2: ax lung · axial · 0.84mm/px · z∈[+1254,+1366]mm · 5 of 86 slices shown]
[im 15/86  lung]
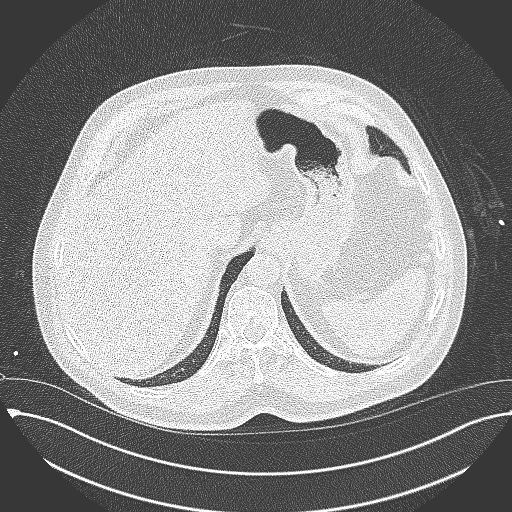
[im 29/86  lung]
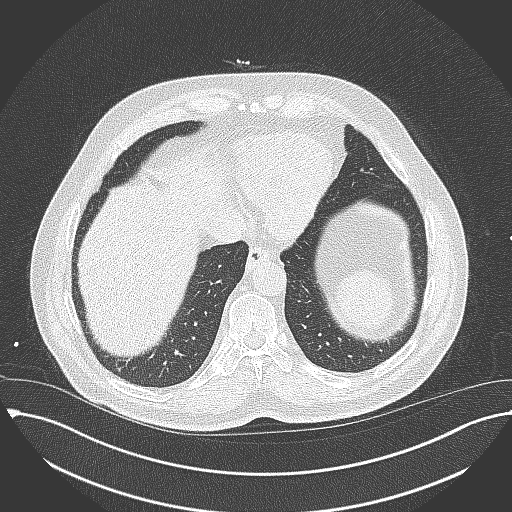
[im 43/86  lung]
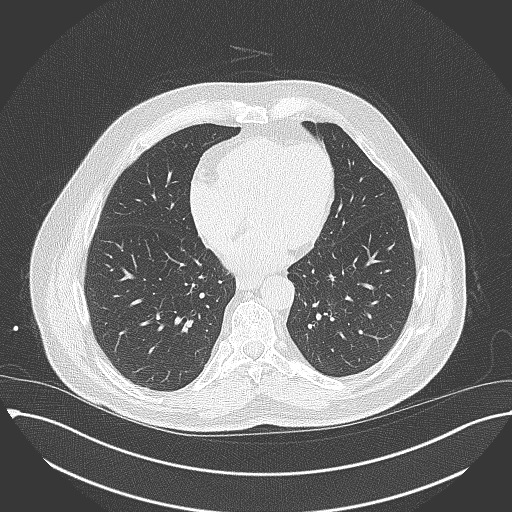
[im 57/86  lung]
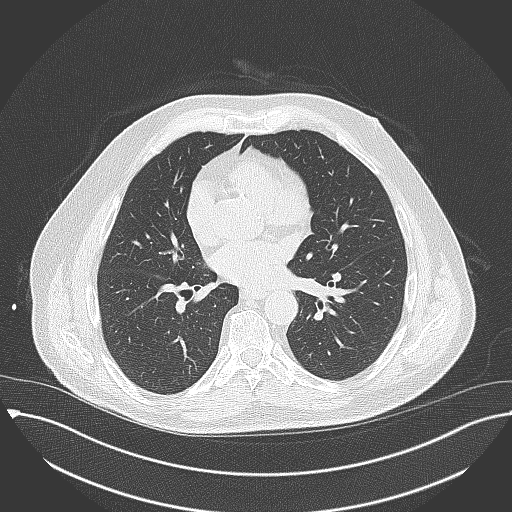
[im 71/86  lung]
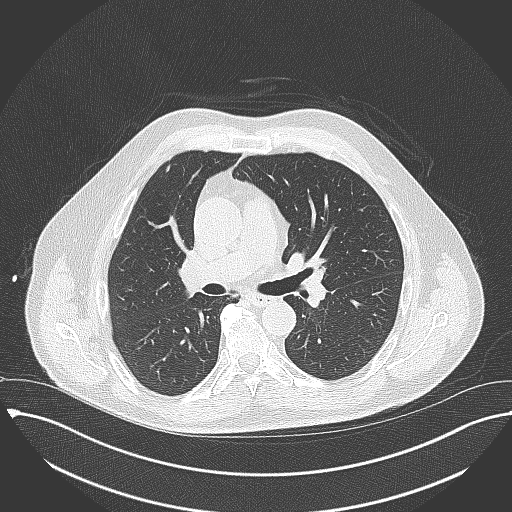

[Series 3: cascseq 3.0 sa36 70% (id) · axial · 0.41mm/px · z∈[+1269,+1353]mm · 3 of 57 slices shown]
[im 15/57  vessel]
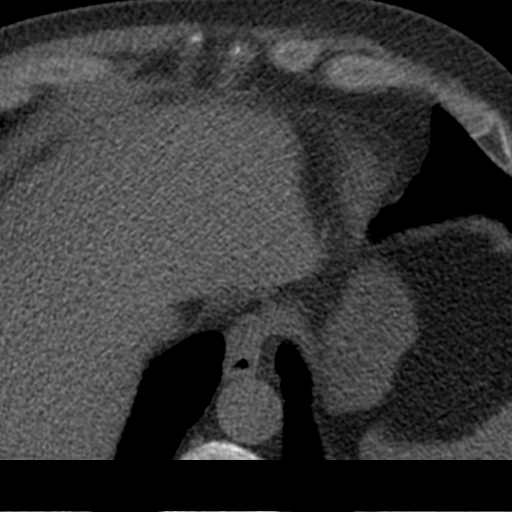
[im 29/57  vessel]
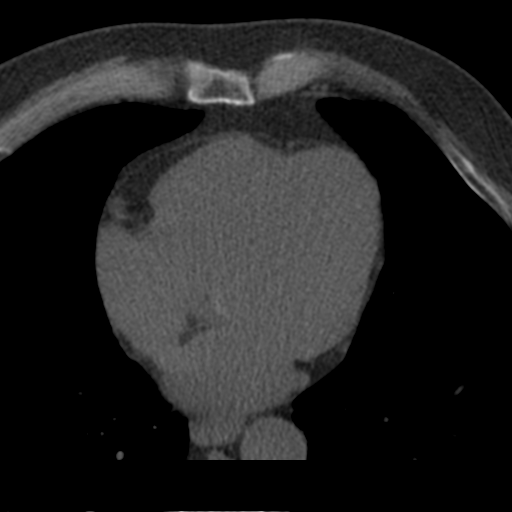
[im 43/57  vessel]
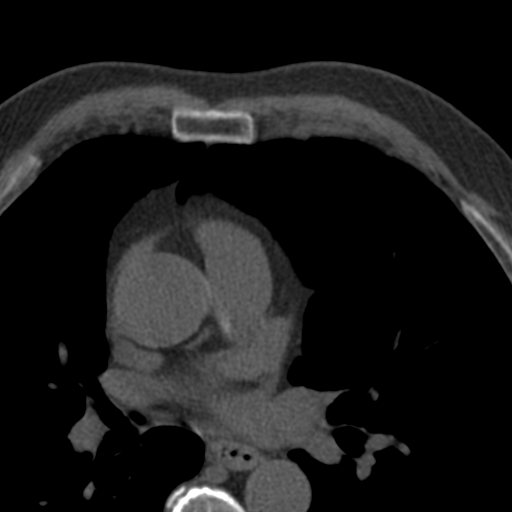

[Series 4: ax st · axial · 0.84mm/px · z∈[+1250,+1370]mm · 6 of 86 slices shown, 8 images]
[im 13/86  vessel]
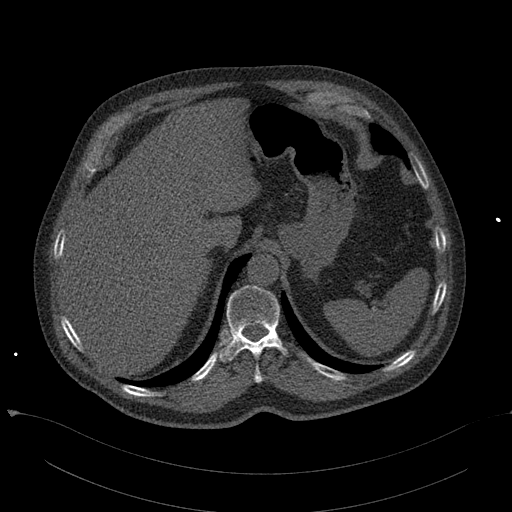
[im 13/86  lung]
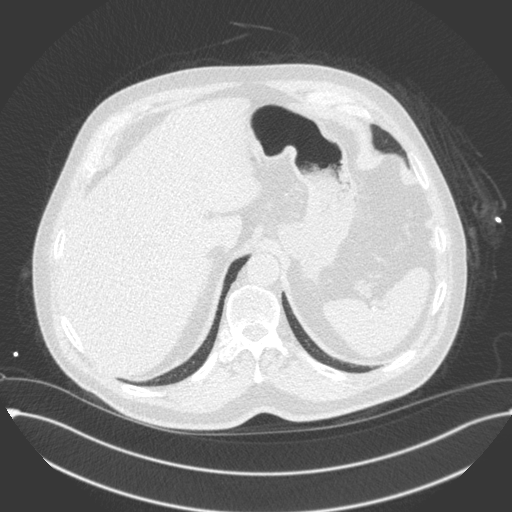
[im 25/86  vessel]
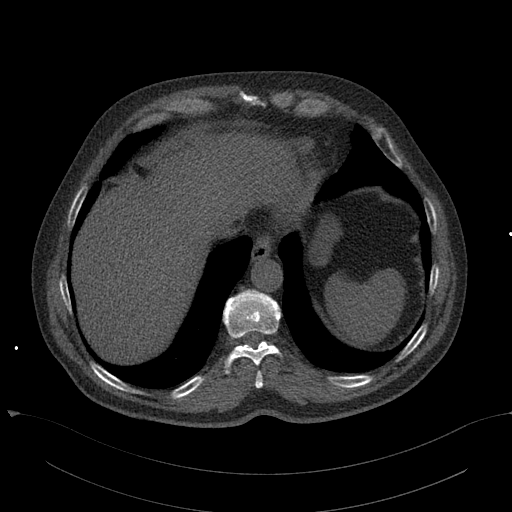
[im 37/86  vessel]
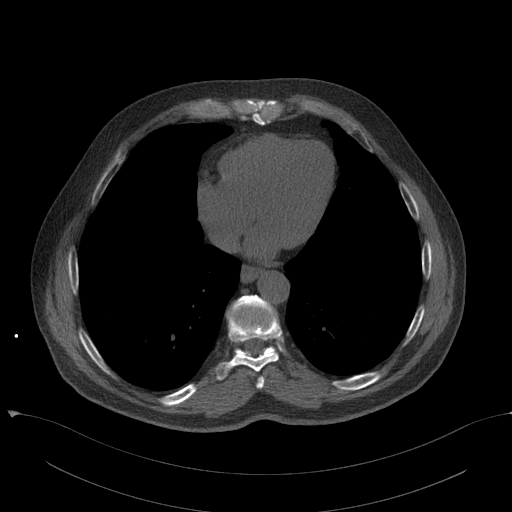
[im 49/86  vessel]
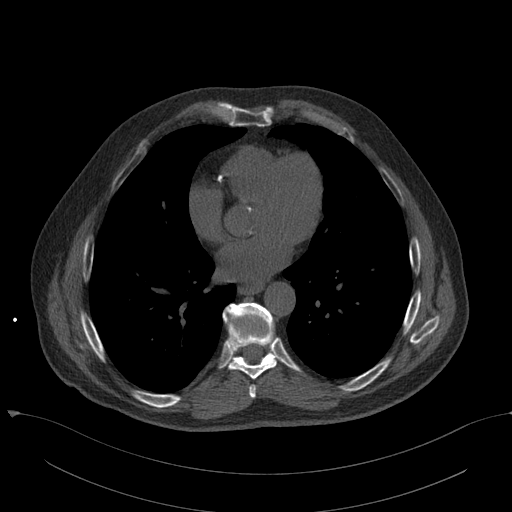
[im 61/86  vessel]
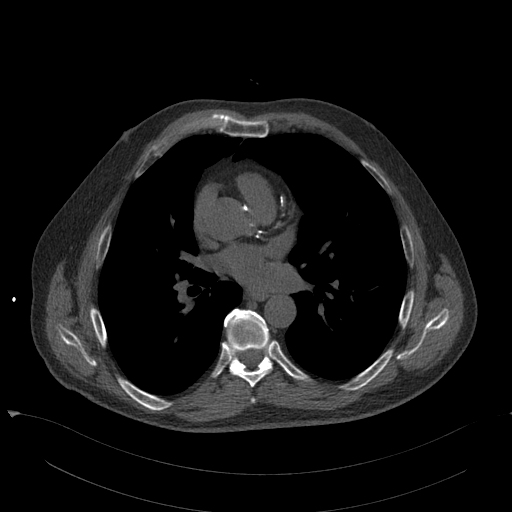
[im 61/86  lung]
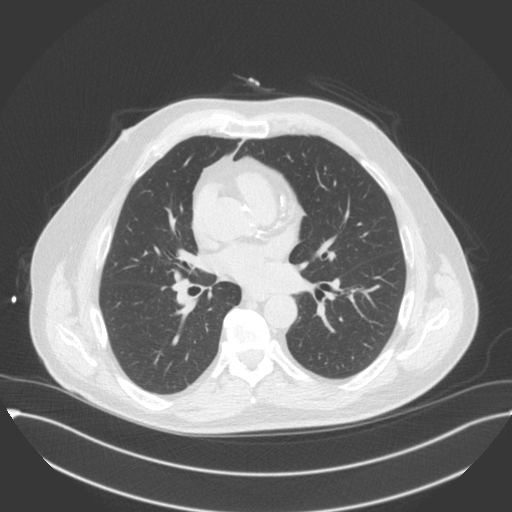
[im 73/86  vessel]
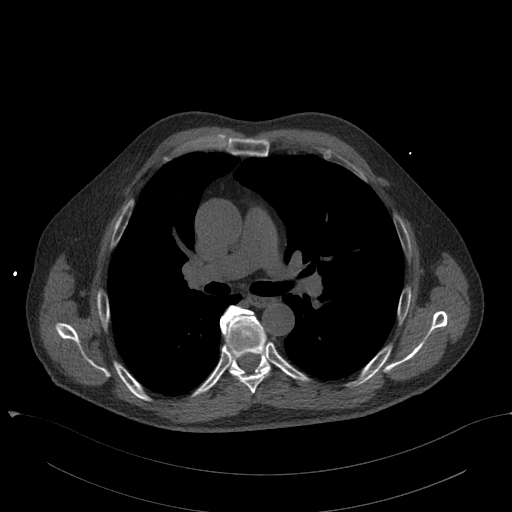

[14 of 20 positions shown; findings below may reference images not displayed]

FINDINGS: Vascular: No significant noncardiac vascular findings.

Mediastinum/Nodes: Visualized mediastinum and hilar regions
demonstrate no lymphadenopathy or masses.

Lungs/Pleura: Visualized lungs show no evidence of pulmonary edema,
consolidation, pneumothorax, nodule or pleural fluid.

Upper Abdomen: Evidence of hepatic steatosis.

Musculoskeletal: No chest wall mass or suspicious bone lesions
identified.
IMPRESSION: Evidence of hepatic steatosis.
FINDINGS: Coronary Calcium Score:

Left main: 177

Left anterior descending artery: 478

Left circumflex artery: 0

Right coronary artery:

Total: 725

Percentile: 82

Pericardium: Normal.

Ascending Aorta: Mildly dilated aortic root (40 mm); aortic
atherosclerosis.

Non-cardiac: See separate report from [REDACTED].
IMPRESSION: Coronary calcium score of 725. This was 82 percentile for age-,
race-, and sex-matched controls.

Mildly dilated aortic root (40 mm); aortic atherosclerosis.

Pulmonary artery upper limits of normal (30 mm).



If CAC=0, it is reasonable to withhold statin therapy and reassess
in 5 to 10 years, as long as higher risk conditions are absent
(diabetes mellitus, family history of premature CHD in first degree
relatives (males <55 years; females <65 years), cigarette smoking,
or LDL >=190 mg/dL).

If CAC is 1 to 99, it is reasonable to initiate statin therapy for
patients >=55 years of age.

If CAC is >=100 or >=75th percentile, it is reasonable to initiate
statin therapy at any age.

Cardiology referral should be considered for patients with CAC
scores >=400 or >=75th percentile.

*9990 AHA/ACC/AACVPR/AAPA/ABC/KHANG/LAMB/ESPO/Tsuyoshi/SET/DAVANTE/SORIN
Guideline on the Management of Blood Cholesterol: A Report of the
American College of Cardiology/American Heart Association Task Force
on Clinical Practice Guidelines. J Am Coll Cardiol.
5650;73(24):6930-6226.

*** End of Addendum ***
EXAM:
OVER-READ INTERPRETATION  CT CHEST

The following report is an over-read performed by radiologist Dr.
Castellani Gonzaga [REDACTED] on 12/31/2021. This
over-read does not include interpretation of cardiac or coronary
anatomy or pathology. The coronary calcium score interpretation by
the cardiologist is attached.
FINDINGS: Vascular: No significant noncardiac vascular findings.

Mediastinum/Nodes: Visualized mediastinum and hilar regions
demonstrate no lymphadenopathy or masses.

Lungs/Pleura: Visualized lungs show no evidence of pulmonary edema,
consolidation, pneumothorax, nodule or pleural fluid.

Upper Abdomen: Evidence of hepatic steatosis.

Musculoskeletal: No chest wall mass or suspicious bone lesions
identified.
IMPRESSION: Evidence of hepatic steatosis.

## 2022-04-13 ENCOUNTER — Telehealth: Payer: Self-pay

## 2022-04-22 ENCOUNTER — Telehealth: Payer: Self-pay

## 2022-04-22 NOTE — Telephone Encounter (Signed)
Please advise if I need to place a new referral

## 2022-04-22 NOTE — Telephone Encounter (Signed)
Patient called to request change locations for current referral for pain management.  He received call from [facility] and Dr. Sheran Luz is no longer with them.  They do not have a pain management doctor on staff at this time.  Please change referral location to another pain management provider.  Patient can be reached at [phone] if needed.

## 2022-04-24 NOTE — Telephone Encounter (Signed)
noted 

## 2022-05-05 DIAGNOSIS — G8929 Other chronic pain: Secondary | ICD-10-CM | POA: Diagnosis not present

## 2022-05-05 DIAGNOSIS — M545 Low back pain, unspecified: Secondary | ICD-10-CM | POA: Diagnosis not present

## 2022-05-05 DIAGNOSIS — M542 Cervicalgia: Secondary | ICD-10-CM | POA: Diagnosis not present

## 2022-05-05 DIAGNOSIS — M5010 Cervical disc disorder with radiculopathy, unspecified cervical region: Secondary | ICD-10-CM | POA: Diagnosis not present

## 2022-05-05 DIAGNOSIS — G8928 Other chronic postprocedural pain: Secondary | ICD-10-CM | POA: Diagnosis not present

## 2022-05-05 DIAGNOSIS — Z8781 Personal history of (healed) traumatic fracture: Secondary | ICD-10-CM | POA: Diagnosis not present

## 2022-05-05 DIAGNOSIS — Z981 Arthrodesis status: Secondary | ICD-10-CM | POA: Diagnosis not present

## 2022-05-07 DIAGNOSIS — M542 Cervicalgia: Secondary | ICD-10-CM | POA: Diagnosis not present

## 2022-05-07 DIAGNOSIS — Z981 Arthrodesis status: Secondary | ICD-10-CM | POA: Diagnosis not present

## 2022-05-26 ENCOUNTER — Telehealth: Payer: Self-pay | Admitting: Family Medicine

## 2022-05-26 NOTE — Telephone Encounter (Signed)
Left message for patient to schedule Annual Wellness Visit.  Please schedule (telephone/video call) with Nurse Health Advisor Tina Betterson, RN at [facility]. Please call [phone] ask for Kathy 

## 2022-06-15 ENCOUNTER — Encounter: Payer: Self-pay | Admitting: Family Medicine

## 2022-06-15 ENCOUNTER — Ambulatory Visit (INDEPENDENT_AMBULATORY_CARE_PROVIDER_SITE_OTHER): Payer: Medicare Other | Admitting: Family Medicine

## 2022-06-15 VITALS — BP 97/59 | HR 68 | Temp 98.3°F | Ht 70.0 in | Wt 194.0 lb

## 2022-06-15 DIAGNOSIS — E785 Hyperlipidemia, unspecified: Secondary | ICD-10-CM

## 2022-06-15 DIAGNOSIS — G8929 Other chronic pain: Secondary | ICD-10-CM

## 2022-06-15 DIAGNOSIS — E782 Mixed hyperlipidemia: Secondary | ICD-10-CM | POA: Diagnosis not present

## 2022-06-15 DIAGNOSIS — M545 Low back pain, unspecified: Secondary | ICD-10-CM

## 2022-06-15 DIAGNOSIS — G4700 Insomnia, unspecified: Secondary | ICD-10-CM

## 2022-06-15 DIAGNOSIS — I10 Essential (primary) hypertension: Secondary | ICD-10-CM | POA: Diagnosis not present

## 2022-06-15 DIAGNOSIS — F1190 Opioid use, unspecified, uncomplicated: Secondary | ICD-10-CM | POA: Diagnosis not present

## 2022-06-15 DIAGNOSIS — G8928 Other chronic postprocedural pain: Secondary | ICD-10-CM

## 2022-06-15 DIAGNOSIS — F5101 Primary insomnia: Secondary | ICD-10-CM | POA: Diagnosis not present

## 2022-06-15 DIAGNOSIS — M1990 Unspecified osteoarthritis, unspecified site: Secondary | ICD-10-CM

## 2022-06-15 DIAGNOSIS — M5010 Cervical disc disorder with radiculopathy, unspecified cervical region: Secondary | ICD-10-CM | POA: Diagnosis not present

## 2022-06-15 DIAGNOSIS — I7121 Aneurysm of the ascending aorta, without rupture: Secondary | ICD-10-CM

## 2022-06-15 MED ORDER — PREDNISONE 20 MG PO TABS: 20.0000 mg | ORAL_TABLET | Freq: Every day | ORAL | 0 refills | Status: DC

## 2022-06-15 MED ORDER — DICLOFENAC SODIUM ER 100 MG PO TB24: 100.0000 mg | ORAL_TABLET | Freq: Every day | ORAL | 1 refills | Status: DC

## 2022-06-15 MED ORDER — BENAZEPRIL HCL 40 MG PO TABS: 40.0000 mg | ORAL_TABLET | Freq: Every day | ORAL | 3 refills | Status: DC

## 2022-06-15 MED ORDER — PREGABALIN 150 MG PO CAPS: 150.0000 mg | ORAL_CAPSULE | Freq: Two times a day (BID) | ORAL | 1 refills | Status: DC

## 2022-06-15 MED ORDER — METAXALONE 800 MG PO TABS: 800.0000 mg | ORAL_TABLET | Freq: Three times a day (TID) | ORAL | 2 refills | Status: DC

## 2022-06-15 MED ORDER — LOVASTATIN 40 MG PO TABS: 80.0000 mg | ORAL_TABLET | Freq: Every day | ORAL | 3 refills | Status: DC

## 2022-06-15 MED ORDER — HYDROCHLOROTHIAZIDE 25 MG PO TABS
25.0000 mg | ORAL_TABLET | Freq: Every day | ORAL | 1 refills | Status: DC
Start: 2022-06-15 — End: 2022-09-07

## 2022-06-15 MED ORDER — HYDROCODONE-ACETAMINOPHEN 10-325 MG PO TABS: 1.0000 | ORAL_TABLET | Freq: Three times a day (TID) | ORAL | 0 refills | Status: DC | PRN

## 2022-06-15 NOTE — Addendum Note (Signed)
Addended by: Felix Pacini A on: 06/15/2022 09:33 AM   Modules accepted: Orders

## 2022-06-15 NOTE — Addendum Note (Signed)
Addended by: Felix Pacini A on: 06/15/2022 09:57 AM   Modules accepted: Orders
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
## 2022-06-17 ENCOUNTER — Ambulatory Visit (INDEPENDENT_AMBULATORY_CARE_PROVIDER_SITE_OTHER): Payer: Medicare Other

## 2022-06-17 DIAGNOSIS — Z0000 Encounter for general adult medical examination without abnormal findings: Secondary | ICD-10-CM

## 2022-06-17 DIAGNOSIS — Z0100 Encounter for examination of eyes and vision without abnormal findings: Secondary | ICD-10-CM

## 2022-06-17 NOTE — Patient Instructions (Signed)
Health Maintenance, Male Adopting a healthy lifestyle and getting preventive care are important in promoting health and wellness. Ask your health care provider about: The right schedule for you to have regular tests and exams. Things you can do on your own to prevent diseases and keep yourself healthy. What should I know about diet, weight, and exercise? Eat a healthy diet  Eat a diet that includes plenty of vegetables, fruits, low-fat dairy products, and lean protein. Do not eat a lot of foods that are high in solid fats, added sugars, or sodium. Maintain a healthy weight Body mass index (BMI) is a measurement that can be used to identify possible weight problems. It estimates body fat based on height and weight. Your health care provider can help determine your BMI and help you achieve or maintain a healthy weight. Get regular exercise Get regular exercise. This is one of the most important things you can do for your health. Most adults should: Exercise for at least 150 minutes each week. The exercise should increase your heart rate and make you sweat (moderate-intensity exercise). Do strengthening exercises at least twice a week. This is in addition to the moderate-intensity exercise. Spend less time sitting. Even light physical activity can be beneficial. Watch cholesterol and blood lipids Have your blood tested for lipids and cholesterol at 69 years of age, then have this test every 5 years. You may need to have your cholesterol levels checked more often if: Your lipid or cholesterol levels are high. You are older than 69 years of age. You are at high risk for heart disease. What should I know about cancer screening? Many types of cancers can be detected early and may often be prevented. Depending on your health history and family history, you may need to have cancer screening at various ages. This may include screening for: Colorectal cancer. Prostate cancer. Skin cancer. Lung  cancer. What should I know about heart disease, diabetes, and high blood pressure? Blood pressure and heart disease High blood pressure causes heart disease and increases the risk of stroke. This is more likely to develop in people who have high blood pressure readings or are overweight. Talk with your health care provider about your target blood pressure readings. Have your blood pressure checked: Every 3-5 years if you are 18-39 years of age. Every year if you are 40 years old or older. If you are between the ages of 65 and 75 and are a current or former smoker, ask your health care provider if you should have a one-time screening for abdominal aortic aneurysm (AAA). Diabetes Have regular diabetes screenings. This checks your fasting blood sugar level. Have the screening done: Once every three years after age 45 if you are at a normal weight and have a low risk for diabetes. More often and at a younger age if you are overweight or have a high risk for diabetes. What should I know about preventing infection? Hepatitis B If you have a higher risk for hepatitis B, you should be screened for this virus. Talk with your health care provider to find out if you are at risk for hepatitis B infection. Hepatitis C Blood testing is recommended for: Everyone born from 1945 through 1965. Anyone with known risk factors for hepatitis C. Sexually transmitted infections (STIs) You should be screened each year for STIs, including gonorrhea and chlamydia, if: You are sexually active and are younger than 69 years of age. You are older than 69 years of age and your   health care provider tells you that you are at risk for this type of infection. Your sexual activity has changed since you were last screened, and you are at increased risk for chlamydia or gonorrhea. Ask your health care provider if you are at risk. Ask your health care provider about whether you are at high risk for HIV. Your health care provider  may recommend a prescription medicine to help prevent HIV infection. If you choose to take medicine to prevent HIV, you should first get tested for HIV. You should then be tested every 3 months for as long as you are taking the medicine. Follow these instructions at home: Alcohol use Do not drink alcohol if your health care provider tells you not to drink. If you drink alcohol: Limit how much you have to 0-2 drinks a day. Know how much alcohol is in your drink. In the [place] one drink equals one 12 oz bottle of beer (355 mL), one 5 oz glass of wine (148 mL), or one 1 oz glass of hard liquor (44 mL). Lifestyle Do not use any products that contain nicotine or tobacco. These products include cigarettes, chewing tobacco, and vaping devices, such as e-cigarettes. If you need help quitting, ask your health care provider. Do not use street drugs. Do not share needles. Ask your health care provider for help if you need support or information about quitting drugs. General instructions Schedule regular health, dental, and eye exams. Stay current with your vaccines. Tell your health care provider if: You often feel depressed. You have ever been abused or do not feel safe at home. Summary Adopting a healthy lifestyle and getting preventive care are important in promoting health and wellness. Follow your health care provider's instructions about healthy diet, exercising, and getting tested or screened for diseases. Follow your health care provider's instructions on monitoring your cholesterol and blood pressure. This information is not intended to replace advice given to you by your health care provider. Make sure you discuss any questions you have with your health care provider. Document Revised: 02/24/2021 Document Reviewed: 02/24/2021 Elsevier Patient Education  2023 [employer]  
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
## 2022-06-24 ENCOUNTER — Ambulatory Visit: Payer: Medicare Other | Admitting: Family Medicine

## 2022-08-12 ENCOUNTER — Telehealth: Payer: Self-pay | Admitting: Family Medicine

## 2022-08-12 NOTE — Telephone Encounter (Signed)
Pt states that a lot of pharmacies are currently out of Hydrocodone. He is asking for an alternative. Please contact patient with additional information.

## 2022-08-12 NOTE — Telephone Encounter (Signed)
Please advise patient he would need a virtual video visit to discuss need of different medication.  Given these are controlled substance, it would not be please legal for me to prescribe him a different medication that is a controlled substance without seeing him. The other option is as we allow a transfer of this prescription if he finds a pharmacy that has it in [place]

## 2022-08-13 NOTE — Telephone Encounter (Signed)
Spoke with patient regarding results/recommendations.  

## 2022-08-14 ENCOUNTER — Encounter: Payer: Self-pay | Admitting: Family Medicine

## 2022-08-14 ENCOUNTER — Ambulatory Visit (INDEPENDENT_AMBULATORY_CARE_PROVIDER_SITE_OTHER): Payer: Medicare Other | Admitting: Family Medicine

## 2022-08-14 VITALS — BP 116/77 | HR 70 | Temp 97.8°F | Ht 70.0 in | Wt 188.6 lb

## 2022-08-14 DIAGNOSIS — G8929 Other chronic pain: Secondary | ICD-10-CM | POA: Diagnosis not present

## 2022-08-14 DIAGNOSIS — M542 Cervicalgia: Secondary | ICD-10-CM

## 2022-08-14 DIAGNOSIS — M5010 Cervical disc disorder with radiculopathy, unspecified cervical region: Secondary | ICD-10-CM

## 2022-08-14 MED ORDER — OXYCODONE HCL 5 MG PO TABS
5.0000 mg | ORAL_TABLET | Freq: Four times a day (QID) | ORAL | 0 refills | Status: DC | PRN
Start: 2022-08-14 — End: 2022-09-07

## 2022-08-14 MED ORDER — PREDNISONE 50 MG PO TABS: 50.0000 mg | ORAL_TABLET | Freq: Every day | ORAL | 0 refills | Status: DC

## 2022-08-14 MED ORDER — OXYCODONE HCL 5 MG PO TABS: 5.0000 mg | ORAL_TABLET | Freq: Four times a day (QID) | ORAL | 0 refills | Status: DC | PRN

## 2022-08-14 MED ORDER — PREDNISONE 10 MG PO TABS: 5.0000 mg | ORAL_TABLET | Freq: Every day | ORAL | 1 refills | Status: DC
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
## 2022-09-07 ENCOUNTER — Ambulatory Visit (INDEPENDENT_AMBULATORY_CARE_PROVIDER_SITE_OTHER): Payer: Medicare Other | Admitting: Family Medicine

## 2022-09-07 ENCOUNTER — Encounter: Payer: Self-pay | Admitting: Family Medicine

## 2022-09-07 VITALS — BP 134/84 | HR 73 | Temp 98.3°F | Wt 187.4 lb

## 2022-09-07 DIAGNOSIS — Z79899 Other long term (current) drug therapy: Secondary | ICD-10-CM | POA: Diagnosis not present

## 2022-09-07 DIAGNOSIS — Z23 Encounter for immunization: Secondary | ICD-10-CM | POA: Diagnosis not present

## 2022-09-07 DIAGNOSIS — F5101 Primary insomnia: Secondary | ICD-10-CM | POA: Diagnosis not present

## 2022-09-07 DIAGNOSIS — Z8781 Personal history of (healed) traumatic fracture: Secondary | ICD-10-CM

## 2022-09-07 DIAGNOSIS — I10 Essential (primary) hypertension: Secondary | ICD-10-CM | POA: Diagnosis not present

## 2022-09-07 DIAGNOSIS — M5010 Cervical disc disorder with radiculopathy, unspecified cervical region: Secondary | ICD-10-CM

## 2022-09-07 DIAGNOSIS — G8929 Other chronic pain: Secondary | ICD-10-CM

## 2022-09-07 DIAGNOSIS — G8928 Other chronic postprocedural pain: Secondary | ICD-10-CM

## 2022-09-07 DIAGNOSIS — F1190 Opioid use, unspecified, uncomplicated: Secondary | ICD-10-CM

## 2022-09-07 DIAGNOSIS — M1990 Unspecified osteoarthritis, unspecified site: Secondary | ICD-10-CM

## 2022-09-07 DIAGNOSIS — M545 Low back pain, unspecified: Secondary | ICD-10-CM

## 2022-09-07 DIAGNOSIS — E785 Hyperlipidemia, unspecified: Secondary | ICD-10-CM

## 2022-09-07 DIAGNOSIS — E782 Mixed hyperlipidemia: Secondary | ICD-10-CM | POA: Diagnosis not present

## 2022-09-07 LAB — CBC
HCT: 40.9 % (ref 39.0–52.0)
Hemoglobin: 14.1 g/dL (ref 13.0–17.0)
MCHC: 34.5 g/dL (ref 30.0–36.0)
MCV: 94.6 fl (ref 78.0–100.0)
Platelets: 179 10*3/uL (ref 150.0–400.0)
RBC: 4.33 Mil/uL (ref 4.22–5.81)
RDW: 12.7 % (ref 11.5–15.5)
WBC: 6.8 10*3/uL (ref 4.0–10.5)

## 2022-09-07 LAB — TSH: TSH: 1.03 u[IU]/mL (ref 0.35–5.50)

## 2022-09-07 LAB — HEMOGLOBIN A1C: Hgb A1c MFr Bld: 5.7 % (ref 4.6–6.5)

## 2022-09-07 MED ORDER — LOVASTATIN 40 MG PO TABS: 80.0000 mg | ORAL_TABLET | Freq: Every day | ORAL | 3 refills | Status: DC

## 2022-09-07 MED ORDER — DICLOFENAC SODIUM ER 100 MG PO TB24
100.0000 mg | ORAL_TABLET | Freq: Every day | ORAL | 1 refills | Status: DC
Start: 2022-09-07 — End: 2022-11-25

## 2022-09-07 MED ORDER — BENAZEPRIL HCL 40 MG PO TABS: 40.0000 mg | ORAL_TABLET | Freq: Every day | ORAL | 3 refills | Status: DC

## 2022-09-07 MED ORDER — HYDROCODONE-ACETAMINOPHEN 10-325 MG PO TABS: 1.0000 | ORAL_TABLET | Freq: Three times a day (TID) | ORAL | 0 refills | Status: DC | PRN

## 2022-09-07 MED ORDER — HYDROCHLOROTHIAZIDE 25 MG PO TABS: 25.0000 mg | ORAL_TABLET | Freq: Every day | ORAL | 1 refills | Status: DC

## 2022-09-07 MED ORDER — PREGABALIN 150 MG PO CAPS: 150.0000 mg | ORAL_CAPSULE | Freq: Two times a day (BID) | ORAL | 1 refills | Status: DC
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
## 2022-09-07 NOTE — Patient Instructions (Signed)
No follow-ups on file.        Great to see you today.  I have refilled the medication(s) we provide.   If labs were collected, we will inform you of lab results once received either by echart message or telephone call.   - echart message- for normal results that have been seen by the patient already.   - telephone call: abnormal results or if patient has not viewed results in their echart.  

## 2022-09-08 LAB — COMPREHENSIVE METABOLIC PANEL
ALT: 30 U/L (ref 0–53)
AST: 33 U/L (ref 0–37)
Albumin: 4.7 g/dL (ref 3.5–5.2)
Alkaline Phosphatase: 49 U/L (ref 39–117)
BUN: 12 mg/dL (ref 6–23)
CO2: 26 mEq/L (ref 19–32)
Calcium: 9 mg/dL (ref 8.4–10.5)
Chloride: 93 mEq/L — ABNORMAL LOW (ref 96–112)
Creatinine, Ser: 0.8 mg/dL (ref 0.40–1.50)
GFR: 90.68 mL/min (ref >60.00)
Glucose, Bld: 82 mg/dL (ref 70–99)
Potassium: 3.5 mEq/L (ref 3.5–5.1)
Sodium: 131 mEq/L — ABNORMAL LOW (ref 135–145)
Total Bilirubin: 0.8 mg/dL (ref 0.2–1.2)
Total Protein: 7 g/dL (ref 6.0–8.3)

## 2022-09-08 LAB — LIPID PANEL
Cholesterol: 141 mg/dL (ref 0–200)
HDL: 73.5 mg/dL (ref >39.00)
LDL Cholesterol: 48 mg/dL (ref 0–99)
NonHDL: 67.8
Total CHOL/HDL Ratio: 2
Triglycerides: 100 mg/dL (ref 0.0–149.0)
VLDL: 20 mg/dL (ref 0.0–40.0)

## 2022-09-15 ENCOUNTER — Ambulatory Visit: Payer: Medicare Other | Admitting: Family Medicine

## 2022-10-14 ENCOUNTER — Telehealth: Payer: Self-pay | Admitting: Family Medicine

## 2022-10-14 NOTE — Telephone Encounter (Signed)
Mr. Brian Murray continues to have issues when picking up his prescription for Hydrocodone. [facility] [place] [facility] for [facility] 30 day supply script. He states he has not had the medication for a couple of months. IF unable to get the Hydrocodone a 30 day supply of Oxycodone will have to work. Please advise patient. [facility] [place]

## 2022-10-15 NOTE — Telephone Encounter (Signed)
Patient picked up a 30 day oxycodone prescription on 09/13/2022 secondary to pharmacy not being able to obtain the hydrocodone tablets. If he still has not picked up another prescription-I would encourage him to pick up the hydrocodone that they have in stock and consider obtaining his old substance prescriptions from a [place] pharmacy if possible.  He will need to call in at the end of the 30-day hydrocodone prescription to let us know if he was able to use his other prescription.

## 2022-10-15 NOTE — Telephone Encounter (Signed)
LVM for pt to CB regarding medication.  ?

## 2022-10-15 NOTE — Telephone Encounter (Signed)
Please advise 

## 2022-10-16 NOTE — Telephone Encounter (Signed)
Noted  

## 2022-10-16 NOTE — Telephone Encounter (Signed)
LVM for pt to CB regarding medication.  ?

## 2022-10-16 NOTE — Telephone Encounter (Signed)
I informed patient of Dr. Blenda Bridegroom Suggestion. Patient agreed and said he would do that.

## 2022-10-27 DIAGNOSIS — M542 Cervicalgia: Secondary | ICD-10-CM | POA: Diagnosis not present

## 2022-10-27 DIAGNOSIS — Z981 Arthrodesis status: Secondary | ICD-10-CM | POA: Diagnosis not present

## 2022-10-31 ENCOUNTER — Other Ambulatory Visit: Payer: Self-pay | Admitting: Neurosurgery

## 2022-10-31 DIAGNOSIS — M542 Cervicalgia: Secondary | ICD-10-CM

## 2022-11-18 ENCOUNTER — Telehealth: Payer: Self-pay

## 2022-11-18 DIAGNOSIS — M545 Low back pain, unspecified: Secondary | ICD-10-CM

## 2022-11-18 DIAGNOSIS — G8929 Other chronic pain: Secondary | ICD-10-CM

## 2022-11-18 DIAGNOSIS — G8928 Other chronic postprocedural pain: Secondary | ICD-10-CM

## 2022-11-18 DIAGNOSIS — M1990 Unspecified osteoarthritis, unspecified site: Secondary | ICD-10-CM

## 2022-11-18 DIAGNOSIS — M5010 Cervical disc disorder with radiculopathy, unspecified cervical region: Secondary | ICD-10-CM

## 2022-11-18 DIAGNOSIS — F1190 Opioid use, unspecified, uncomplicated: Secondary | ICD-10-CM

## 2022-11-18 NOTE — Telephone Encounter (Signed)
Please advise 

## 2022-11-18 NOTE — Telephone Encounter (Signed)
Prescription Request  11/18/2022  Is this a "Controlled Substance" medicine? Yes  LOV: Visit date not found  What is the name of the medication or equipment?   HYDROcodone-acetaminophen (NORCO) 10-325 MG tablet   Have you contacted your pharmacy to request a refill? No   Which pharmacy would you like this sent to?  [id number] [place], [place] - [address] [place] [address] Phone: [phone] Fax: [phone]    Patient notified that their request is being sent to the clinical staff for review and that they should receive a response within 2 business days.   Please advise at Mobile [phone] (mobile)   Only filled 30 d/s last time - Dr. Raoul Pitch told patient to call back to get another refill [facility] in [facility].

## 2022-11-19 MED ORDER — HYDROCODONE-ACETAMINOPHEN 10-325 MG PO TABS: 1.0000 | ORAL_TABLET | Freq: Three times a day (TID) | ORAL | 0 refills | Status: DC | PRN

## 2022-11-19 NOTE — Telephone Encounter (Signed)
completed

## 2022-11-20 ENCOUNTER — Other Ambulatory Visit: Payer: Self-pay

## 2022-11-20 DIAGNOSIS — M545 Low back pain, unspecified: Secondary | ICD-10-CM

## 2022-11-20 DIAGNOSIS — F1190 Opioid use, unspecified, uncomplicated: Secondary | ICD-10-CM

## 2022-11-20 DIAGNOSIS — G8928 Other chronic postprocedural pain: Secondary | ICD-10-CM

## 2022-11-20 DIAGNOSIS — M5010 Cervical disc disorder with radiculopathy, unspecified cervical region: Secondary | ICD-10-CM

## 2022-11-20 DIAGNOSIS — G8929 Other chronic pain: Secondary | ICD-10-CM

## 2022-11-20 DIAGNOSIS — M1990 Unspecified osteoarthritis, unspecified site: Secondary | ICD-10-CM

## 2022-11-20 MED ORDER — HYDROCODONE-ACETAMINOPHEN 10-325 MG PO TABS: 1.0000 | ORAL_TABLET | Freq: Three times a day (TID) | ORAL | 0 refills | Status: DC | PRN

## 2022-11-20 NOTE — Telephone Encounter (Signed)
Completed.

## 2022-11-20 NOTE — Telephone Encounter (Signed)
Requesting: Hydrocodone Contract:07/21/21 UDS:07/21/21 Last Visit:09/07/22 Next Visit:11/25/22 Last Refill:11/19/22 (270,0)  Please Advise  Rx pending, will [facility] if [facility]

## 2022-11-20 NOTE — Telephone Encounter (Signed)
Patient would like Hydrocodone to be sent to [facility] on [address] in [place] because [facility] not [facility]. Please advise change.   Patient would like it to be a Quantity of #270

## 2022-11-20 NOTE — Telephone Encounter (Signed)
Noted  

## 2022-11-25 ENCOUNTER — Encounter: Payer: Self-pay | Admitting: Family Medicine

## 2022-11-25 ENCOUNTER — Ambulatory Visit (INDEPENDENT_AMBULATORY_CARE_PROVIDER_SITE_OTHER): Payer: Medicare Other | Admitting: Family Medicine

## 2022-11-25 VITALS — BP 109/72 | HR 74 | Temp 98.4°F | Wt 185.4 lb

## 2022-11-25 DIAGNOSIS — Z8781 Personal history of (healed) traumatic fracture: Secondary | ICD-10-CM

## 2022-11-25 DIAGNOSIS — M1990 Unspecified osteoarthritis, unspecified site: Secondary | ICD-10-CM

## 2022-11-25 DIAGNOSIS — G8929 Other chronic pain: Secondary | ICD-10-CM | POA: Diagnosis not present

## 2022-11-25 DIAGNOSIS — I2510 Atherosclerotic heart disease of native coronary artery without angina pectoris: Secondary | ICD-10-CM

## 2022-11-25 DIAGNOSIS — F5101 Primary insomnia: Secondary | ICD-10-CM

## 2022-11-25 DIAGNOSIS — I10 Essential (primary) hypertension: Secondary | ICD-10-CM | POA: Diagnosis not present

## 2022-11-25 DIAGNOSIS — M5010 Cervical disc disorder with radiculopathy, unspecified cervical region: Secondary | ICD-10-CM | POA: Diagnosis not present

## 2022-11-25 DIAGNOSIS — E782 Mixed hyperlipidemia: Secondary | ICD-10-CM | POA: Diagnosis not present

## 2022-11-25 DIAGNOSIS — I2584 Coronary atherosclerosis due to calcified coronary lesion: Secondary | ICD-10-CM | POA: Diagnosis not present

## 2022-11-25 DIAGNOSIS — R931 Abnormal findings on diagnostic imaging of heart and coronary circulation: Secondary | ICD-10-CM

## 2022-11-25 DIAGNOSIS — F1190 Opioid use, unspecified, uncomplicated: Secondary | ICD-10-CM | POA: Diagnosis not present

## 2022-11-25 DIAGNOSIS — M545 Low back pain, unspecified: Secondary | ICD-10-CM | POA: Diagnosis not present

## 2022-11-25 MED ORDER — DICLOFENAC SODIUM ER 100 MG PO TB24: 100.0000 mg | ORAL_TABLET | Freq: Every day | ORAL | 1 refills | Status: DC

## 2022-11-25 MED ORDER — PREDNISONE 5 MG PO TABS: 2.5000 mg | ORAL_TABLET | Freq: Every day | ORAL | 1 refills | Status: DC

## 2022-11-25 MED ORDER — PREGABALIN 150 MG PO CAPS: 150.0000 mg | ORAL_CAPSULE | Freq: Two times a day (BID) | ORAL | 1 refills | Status: DC

## 2022-11-25 MED ORDER — BENAZEPRIL HCL 40 MG PO TABS: 40.0000 mg | ORAL_TABLET | Freq: Every day | ORAL | 3 refills | Status: DC

## 2022-11-25 MED ORDER — METAXALONE 800 MG PO TABS: 800.0000 mg | ORAL_TABLET | Freq: Three times a day (TID) | ORAL | 2 refills | Status: DC

## 2022-11-25 MED ORDER — OXYCODONE-ACETAMINOPHEN 10-325 MG PO TABS: 1.0000 | ORAL_TABLET | Freq: Three times a day (TID) | ORAL | 0 refills | Status: DC

## 2022-11-25 MED ORDER — LOVASTATIN 40 MG PO TABS: 80.0000 mg | ORAL_TABLET | Freq: Every day | ORAL | 3 refills | Status: DC

## 2022-11-25 MED ORDER — HYDROCHLOROTHIAZIDE 25 MG PO TABS: 25.0000 mg | ORAL_TABLET | Freq: Every day | ORAL | 1 refills | Status: DC

## 2022-11-25 NOTE — Patient Instructions (Signed)
Return in about 12 weeks (around 02/17/2023) for Routine chronic condition follow-up.        Great to see you today.  I have refilled the medication(s) we provide.   If labs were collected, we will inform you of lab results once received either by echart message or telephone call.   - echart message- for normal results that have been seen by the patient already.   - telephone call: abnormal results or if patient has not viewed results in their echart.
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
## 2022-12-09 ENCOUNTER — Other Ambulatory Visit (HOSPITAL_COMMUNITY): Payer: Self-pay

## 2022-12-09 ENCOUNTER — Telehealth: Payer: Self-pay

## 2022-12-09 NOTE — Telephone Encounter (Signed)
noted 

## 2022-12-09 NOTE — Telephone Encounter (Signed)
Pharmacy Patient Advocate Encounter   Received notification from [facility] that prior authorization for Metaxalone 862m is required/requested.  Per Test Claim: Provide Exception Process printed notice non-form, submit ePA, Tizanidine, ibu, flurbiprofen; naproxen, etodolac preferred   PA submitted on 12/09/22 to (ins) [employer] D via fax at [phone] Status is pending

## 2022-12-15 NOTE — Telephone Encounter (Signed)
Received a fax regarding Prior Authorization from [employer] for Metaxalone '800mg'$  tabs.  Authorization has been DENIED because the information provided was not sufficient to support approval for medical necessity. The following required information was not provided and/or clarified: 1) if diagnosis includes adjunct to rest, physical therapy, and other measures for the relief of discomforts associated with acute, painful musculoskeletal conditions; AND 2) the specific medical reasons why you are unable to use five of the following covered drugs: naproxen or naproxen ec, etodolac, flurbiprofen, ibu, ketoprofen or tizanidine.  Phone# [phone]  Denial letter attached to chart

## 2022-12-16 NOTE — Telephone Encounter (Signed)
Appeal faxed

## 2022-12-21 ENCOUNTER — Telehealth: Payer: Self-pay | Admitting: Family Medicine

## 2022-12-21 NOTE — Telephone Encounter (Signed)
Appeal forms faxed from 12/18/22 fax

## 2022-12-21 NOTE — Telephone Encounter (Signed)
[place] with [employer] called and is needing someone in clinical to return her call. The call is regarding the appeal process for [facility]. Reference number is [id number] Pincus Badder can be reached at [phone]

## 2023-02-13 ENCOUNTER — Other Ambulatory Visit: Payer: Self-pay | Admitting: Family Medicine

## 2023-02-17 ENCOUNTER — Ambulatory Visit (INDEPENDENT_AMBULATORY_CARE_PROVIDER_SITE_OTHER): Payer: Medicare Other | Admitting: Family Medicine

## 2023-02-17 ENCOUNTER — Encounter: Payer: Self-pay | Admitting: Family Medicine

## 2023-02-17 VITALS — BP 101/67 | HR 78 | Temp 98.4°F | Wt 181.4 lb

## 2023-02-17 DIAGNOSIS — I2510 Atherosclerotic heart disease of native coronary artery without angina pectoris: Secondary | ICD-10-CM

## 2023-02-17 DIAGNOSIS — G8928 Other chronic postprocedural pain: Secondary | ICD-10-CM

## 2023-02-17 DIAGNOSIS — Z8781 Personal history of (healed) traumatic fracture: Secondary | ICD-10-CM

## 2023-02-17 DIAGNOSIS — M1990 Unspecified osteoarthritis, unspecified site: Secondary | ICD-10-CM

## 2023-02-17 DIAGNOSIS — E782 Mixed hyperlipidemia: Secondary | ICD-10-CM | POA: Diagnosis not present

## 2023-02-17 DIAGNOSIS — F1190 Opioid use, unspecified, uncomplicated: Secondary | ICD-10-CM

## 2023-02-17 DIAGNOSIS — I10 Essential (primary) hypertension: Secondary | ICD-10-CM | POA: Diagnosis not present

## 2023-02-17 DIAGNOSIS — M542 Cervicalgia: Secondary | ICD-10-CM

## 2023-02-17 DIAGNOSIS — Z79899 Other long term (current) drug therapy: Secondary | ICD-10-CM

## 2023-02-17 DIAGNOSIS — M5010 Cervical disc disorder with radiculopathy, unspecified cervical region: Secondary | ICD-10-CM

## 2023-02-17 DIAGNOSIS — R931 Abnormal findings on diagnostic imaging of heart and coronary circulation: Secondary | ICD-10-CM

## 2023-02-17 DIAGNOSIS — G8929 Other chronic pain: Secondary | ICD-10-CM

## 2023-02-17 DIAGNOSIS — I77810 Thoracic aortic ectasia: Secondary | ICD-10-CM

## 2023-02-17 DIAGNOSIS — I2584 Coronary atherosclerosis due to calcified coronary lesion: Secondary | ICD-10-CM

## 2023-02-17 DIAGNOSIS — M545 Low back pain, unspecified: Secondary | ICD-10-CM

## 2023-02-17 DIAGNOSIS — F5101 Primary insomnia: Secondary | ICD-10-CM

## 2023-02-17 MED ORDER — OXYCODONE-ACETAMINOPHEN 10-325 MG PO TABS: 1.0000 | ORAL_TABLET | Freq: Three times a day (TID) | ORAL | 0 refills | Status: DC

## 2023-02-17 NOTE — Patient Instructions (Addendum)
Return in about 11 weeks (around 05/05/2023) for Routine chronic condition follow-up.        Great to see you today.  I have refilled the medication(s) we provide.   If labs were collected, we will inform you of lab results once received either by echart message or telephone call.   - echart message- for normal results that have been seen by the patient already.   - telephone call: abnormal results or if patient has not viewed results in their echart.
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
## 2023-05-14 ENCOUNTER — Encounter: Payer: Self-pay | Admitting: Family Medicine

## 2023-05-14 ENCOUNTER — Ambulatory Visit (INDEPENDENT_AMBULATORY_CARE_PROVIDER_SITE_OTHER): Payer: Medicare Other | Admitting: Family Medicine

## 2023-05-14 VITALS — BP 121/82 | HR 78 | Temp 98.2°F | Wt 173.2 lb

## 2023-05-14 DIAGNOSIS — M5010 Cervical disc disorder with radiculopathy, unspecified cervical region: Secondary | ICD-10-CM

## 2023-05-14 DIAGNOSIS — Z8781 Personal history of (healed) traumatic fracture: Secondary | ICD-10-CM | POA: Diagnosis not present

## 2023-05-14 DIAGNOSIS — G8929 Other chronic pain: Secondary | ICD-10-CM

## 2023-05-14 DIAGNOSIS — Z79899 Other long term (current) drug therapy: Secondary | ICD-10-CM | POA: Diagnosis not present

## 2023-05-14 DIAGNOSIS — K5669 Other partial intestinal obstruction: Secondary | ICD-10-CM

## 2023-05-14 DIAGNOSIS — M542 Cervicalgia: Secondary | ICD-10-CM

## 2023-05-14 DIAGNOSIS — R634 Abnormal weight loss: Secondary | ICD-10-CM

## 2023-05-14 DIAGNOSIS — M1990 Unspecified osteoarthritis, unspecified site: Secondary | ICD-10-CM | POA: Diagnosis not present

## 2023-05-14 DIAGNOSIS — R6881 Early satiety: Secondary | ICD-10-CM

## 2023-05-14 DIAGNOSIS — R931 Abnormal findings on diagnostic imaging of heart and coronary circulation: Secondary | ICD-10-CM

## 2023-05-14 DIAGNOSIS — I77810 Thoracic aortic ectasia: Secondary | ICD-10-CM | POA: Diagnosis not present

## 2023-05-14 DIAGNOSIS — R1084 Generalized abdominal pain: Secondary | ICD-10-CM | POA: Diagnosis not present

## 2023-05-14 DIAGNOSIS — M545 Low back pain, unspecified: Secondary | ICD-10-CM | POA: Diagnosis not present

## 2023-05-14 DIAGNOSIS — I2584 Coronary atherosclerosis due to calcified coronary lesion: Secondary | ICD-10-CM

## 2023-05-14 DIAGNOSIS — E782 Mixed hyperlipidemia: Secondary | ICD-10-CM | POA: Diagnosis not present

## 2023-05-14 DIAGNOSIS — I10 Essential (primary) hypertension: Secondary | ICD-10-CM | POA: Diagnosis not present

## 2023-05-14 DIAGNOSIS — I2510 Atherosclerotic heart disease of native coronary artery without angina pectoris: Secondary | ICD-10-CM | POA: Diagnosis not present

## 2023-05-14 DIAGNOSIS — G8928 Other chronic postprocedural pain: Secondary | ICD-10-CM

## 2023-05-14 DIAGNOSIS — F1190 Opioid use, unspecified, uncomplicated: Secondary | ICD-10-CM

## 2023-05-14 LAB — COMPREHENSIVE METABOLIC PANEL
ALT: 34 U/L (ref 0–53)
AST: 49 U/L — ABNORMAL HIGH (ref 0–37)
Albumin: 4.3 g/dL (ref 3.5–5.2)
Alkaline Phosphatase: 61 U/L (ref 39–117)
BUN: 8 mg/dL (ref 6–23)
CO2: 26 mEq/L (ref 19–32)
Calcium: 9 mg/dL (ref 8.4–10.5)
Chloride: 103 mEq/L (ref 96–112)
Creatinine, Ser: 0.81 mg/dL (ref 0.40–1.50)
GFR: 89.91 mL/min (ref >60.00)
Glucose, Bld: 88 mg/dL (ref 70–99)
Potassium: 4 mEq/L (ref 3.5–5.1)
Sodium: 139 mEq/L (ref 135–145)
Total Bilirubin: 0.8 mg/dL (ref 0.2–1.2)
Total Protein: 6.5 g/dL (ref 6.0–8.3)

## 2023-05-14 LAB — CBC WITH DIFFERENTIAL/PLATELET
Basophils Absolute: 0.1 10*3/uL (ref 0.0–0.1)
Basophils Relative: 0.8 % (ref 0.0–3.0)
Eosinophils Absolute: 0.2 10*3/uL (ref 0.0–0.7)
Eosinophils Relative: 2.2 % (ref 0.0–5.0)
HCT: 41.7 % (ref 39.0–52.0)
Hemoglobin: 13.7 g/dL (ref 13.0–17.0)
Lymphocytes Relative: 20.1 % (ref 12.0–46.0)
Lymphs Abs: 1.5 10*3/uL (ref 0.7–4.0)
MCHC: 32.8 g/dL (ref 30.0–36.0)
MCV: 99.5 fl (ref 78.0–100.0)
Monocytes Absolute: 0.6 10*3/uL (ref 0.1–1.0)
Monocytes Relative: 8.2 % (ref 3.0–12.0)
Neutro Abs: 5.2 10*3/uL (ref 1.4–7.7)
Neutrophils Relative %: 68.7 % (ref 43.0–77.0)
Platelets: 179 10*3/uL (ref 150.0–400.0)
RBC: 4.19 Mil/uL — ABNORMAL LOW (ref 4.22–5.81)
RDW: 13.3 % (ref 11.5–15.5)
WBC: 7.6 10*3/uL (ref 4.0–10.5)

## 2023-05-14 LAB — LIPASE: Lipase: 43 U/L (ref 11.0–59.0)

## 2023-05-14 LAB — C-REACTIVE PROTEIN: CRP: <1 mg/dL (ref 0.5–20.0)

## 2023-05-14 MED ORDER — OXYCODONE-ACETAMINOPHEN 10-325 MG PO TABS: 1.0000 | ORAL_TABLET | Freq: Three times a day (TID) | ORAL | 0 refills | Status: DC

## 2023-05-14 MED ORDER — BENAZEPRIL HCL 40 MG PO TABS: 40.0000 mg | ORAL_TABLET | Freq: Every day | ORAL | 1 refills | Status: DC

## 2023-05-14 MED ORDER — PREDNISONE 5 MG PO TABS: 2.5000 mg | ORAL_TABLET | Freq: Every day | ORAL | 1 refills | Status: DC

## 2023-05-14 MED ORDER — PREGABALIN 150 MG PO CAPS: 150.0000 mg | ORAL_CAPSULE | Freq: Two times a day (BID) | ORAL | 1 refills | Status: DC

## 2023-05-14 MED ORDER — PANTOPRAZOLE SODIUM 40 MG PO TBEC: 40.0000 mg | DELAYED_RELEASE_TABLET | Freq: Every day | ORAL | 1 refills | Status: DC

## 2023-05-14 MED ORDER — DICLOFENAC SODIUM ER 100 MG PO TB24: 100.0000 mg | ORAL_TABLET | Freq: Every day | ORAL | 1 refills | Status: DC

## 2023-05-14 NOTE — Patient Instructions (Signed)
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
## 2023-05-17 ENCOUNTER — Ambulatory Visit (HOSPITAL_BASED_OUTPATIENT_CLINIC_OR_DEPARTMENT_OTHER)
Admission: RE | Admit: 2023-05-17 | Discharge: 2023-05-17 | Disposition: A | Discharge status: Home / Self Care | Payer: Medicare Other | Source: Ambulatory Visit | Attending: Family Medicine | Admitting: Family Medicine

## 2023-05-17 DIAGNOSIS — R6881 Early satiety: Secondary | ICD-10-CM | POA: Diagnosis not present

## 2023-05-17 DIAGNOSIS — R1084 Generalized abdominal pain: Secondary | ICD-10-CM | POA: Diagnosis not present

## 2023-05-17 DIAGNOSIS — R634 Abnormal weight loss: Secondary | ICD-10-CM | POA: Diagnosis not present

## 2023-05-17 DIAGNOSIS — N281 Cyst of kidney, acquired: Secondary | ICD-10-CM | POA: Diagnosis not present

## 2023-05-17 DIAGNOSIS — K5730 Diverticulosis of large intestine without perforation or abscess without bleeding: Secondary | ICD-10-CM | POA: Diagnosis not present

## 2023-05-17 DIAGNOSIS — K5669 Other partial intestinal obstruction: Secondary | ICD-10-CM | POA: Diagnosis not present

## 2023-05-17 DIAGNOSIS — N3289 Other specified disorders of bladder: Secondary | ICD-10-CM | POA: Diagnosis not present

## 2023-05-17 MED ORDER — IOHEXOL 300 MG/ML  SOLN
100.0000 mL | Freq: Once | INTRAMUSCULAR | Status: AC | PRN
  Administered 2023-05-17: 100 mL via INTRAVENOUS

## 2023-05-18 ENCOUNTER — Telehealth: Payer: Self-pay | Admitting: Family Medicine

## 2023-05-18 ENCOUNTER — Other Ambulatory Visit (INDEPENDENT_AMBULATORY_CARE_PROVIDER_SITE_OTHER): Payer: Medicare Other

## 2023-05-18 DIAGNOSIS — R1084 Generalized abdominal pain: Secondary | ICD-10-CM | POA: Diagnosis not present

## 2023-05-18 DIAGNOSIS — N281 Cyst of kidney, acquired: Secondary | ICD-10-CM | POA: Insufficient documentation

## 2023-05-18 LAB — POC URINALSYSI DIPSTICK (AUTOMATED)
Bilirubin, UA: NEGATIVE
Blood, UA: NEGATIVE
Glucose, UA: NEGATIVE
Ketones, UA: NEGATIVE
Leukocytes, UA: NEGATIVE
Nitrite, UA: NEGATIVE
Protein, UA: NEGATIVE
Spec Grav, UA: 1.015 (ref 1.010–1.025)
Urobilinogen, UA: 0.2 E.U./dL
pH, UA: 5.5 (ref 5.0–8.0)

## 2023-05-18 MED ORDER — TAMSULOSIN HCL 0.4 MG PO CAPS: 0.4000 mg | ORAL_CAPSULE | Freq: Every day | ORAL | 3 refills | Status: DC

## 2023-05-18 MED ORDER — LEVOFLOXACIN 500 MG PO TABS: 500.0000 mg | ORAL_TABLET | Freq: Every day | ORAL | 0 refills | Status: AC

## 2023-05-18 NOTE — Telephone Encounter (Signed)
Spoke with patient regarding results/recommendations.  

## 2023-05-18 NOTE — Telephone Encounter (Addendum)
Please call patient CT results have returned and we need to evaluate further for possible abnormalities. -He has evidence of sigmoid diverticulosis, which is very common.  He is reassuring it does not look like he has inflammation in this area suggesting infection. -She does have inflammatory changes mildly surrounding the kidneys and left pelvis area  -CRP, CBC, BMP and lipase are normal-which are is reassuring, but does not explain why the inflammatory changes are present    -I have ordered urine culture to be collected today.  After he gives [place] a urine collection, I want him to start a prophylactic antibiotic for kidney/bladder infection while we await results.  Please make sure he understands to give the urine collection today first, then start the antibiotic called it. - I also I have called in a medicine called flomax for him to start daily- this medicine is used to help with urination.  Needs to start this so that we can make sure he is emptying his bladder.  Please have schedule provider follow up in 1 week, sooner if worsening symptoms.

## 2023-05-18 NOTE — Progress Notes (Signed)
Pt came for urine specimen drop off.

## 2023-05-19 ENCOUNTER — Telehealth: Payer: Self-pay | Admitting: Family Medicine

## 2023-05-19 DIAGNOSIS — R109 Unspecified abdominal pain: Secondary | ICD-10-CM

## 2023-05-19 DIAGNOSIS — N320 Bladder-neck obstruction: Secondary | ICD-10-CM

## 2023-05-19 DIAGNOSIS — N281 Cyst of kidney, acquired: Secondary | ICD-10-CM

## 2023-05-19 NOTE — Telephone Encounter (Signed)
Please inform patient his urine is normal.  Recommend he continues the antibiotic to completion.  I have placed a referral to urology to also further evaluate, changes appreciated around kidney and bladder.   We still need to see him here again (aug.6) scheduled for recheck and go over all results.  I am hoping he appreciates improvement w/ antibiotic and starting flomax.

## 2023-05-25 ENCOUNTER — Ambulatory Visit (INDEPENDENT_AMBULATORY_CARE_PROVIDER_SITE_OTHER): Payer: Medicare Other | Admitting: Family Medicine

## 2023-05-25 ENCOUNTER — Other Ambulatory Visit: Payer: Medicare Other

## 2023-05-25 VITALS — BP 107/66 | HR 70 | Temp 97.9°F | Wt 175.2 lb

## 2023-05-25 DIAGNOSIS — R634 Abnormal weight loss: Secondary | ICD-10-CM

## 2023-05-25 DIAGNOSIS — R6881 Early satiety: Secondary | ICD-10-CM

## 2023-05-25 DIAGNOSIS — N281 Cyst of kidney, acquired: Secondary | ICD-10-CM

## 2023-05-25 DIAGNOSIS — R351 Nocturia: Secondary | ICD-10-CM | POA: Diagnosis not present

## 2023-05-25 DIAGNOSIS — R1084 Generalized abdominal pain: Secondary | ICD-10-CM | POA: Diagnosis not present

## 2023-05-25 DIAGNOSIS — N401 Enlarged prostate with lower urinary tract symptoms: Secondary | ICD-10-CM | POA: Diagnosis not present

## 2023-05-25 LAB — PSA: PSA: 0.6 ng/mL (ref 0.10–4.00)
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
## 2023-05-25 NOTE — Patient Instructions (Signed)

## 2023-06-02 ENCOUNTER — Encounter: Payer: Self-pay | Admitting: Urology

## 2023-06-02 ENCOUNTER — Ambulatory Visit (INDEPENDENT_AMBULATORY_CARE_PROVIDER_SITE_OTHER): Payer: Medicare Other | Admitting: Urology

## 2023-06-02 VITALS — BP 105/68 | HR 80 | Ht 70.0 in | Wt 172.0 lb

## 2023-06-02 DIAGNOSIS — N401 Enlarged prostate with lower urinary tract symptoms: Secondary | ICD-10-CM

## 2023-06-02 DIAGNOSIS — N281 Cyst of kidney, acquired: Secondary | ICD-10-CM | POA: Diagnosis not present

## 2023-06-02 LAB — BLADDER SCAN AMB NON-IMAGING

## 2023-06-02 MED ORDER — SILODOSIN 8 MG PO CAPS: 8.0000 mg | ORAL_CAPSULE | Freq: Every day | ORAL | 3 refills | Status: DC

## 2023-06-02 NOTE — Addendum Note (Signed)
Addended by: Carolin Coy on: 06/02/2023 12:19 PM   Modules accepted: Orders
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
## 2023-06-03 ENCOUNTER — Encounter (INDEPENDENT_AMBULATORY_CARE_PROVIDER_SITE_OTHER): Payer: Self-pay

## 2023-06-03 LAB — URINALYSIS, ROUTINE W REFLEX MICROSCOPIC
Bilirubin, UA: NEGATIVE
Glucose, UA: NEGATIVE
Ketones, UA: NEGATIVE
Leukocytes,UA: NEGATIVE
Nitrite, UA: NEGATIVE
Protein,UA: NEGATIVE
RBC, UA: NEGATIVE
Specific Gravity, UA: 1.015 (ref 1.005–1.030)
Urobilinogen, Ur: 0.2 mg/dL (ref 0.2–1.0)
pH, UA: 7 (ref 5.0–7.5)

## 2023-07-30 ENCOUNTER — Ambulatory Visit: Payer: Medicare Other | Admitting: Family Medicine

## 2023-08-30 ENCOUNTER — Ambulatory Visit (INDEPENDENT_AMBULATORY_CARE_PROVIDER_SITE_OTHER): Payer: Medicare Other | Admitting: Family Medicine

## 2023-08-30 ENCOUNTER — Encounter: Payer: Self-pay | Admitting: Family Medicine

## 2023-08-30 VITALS — BP 130/88 | HR 83 | Temp 98.4°F | Wt 174.2 lb

## 2023-08-30 DIAGNOSIS — M542 Cervicalgia: Secondary | ICD-10-CM

## 2023-08-30 DIAGNOSIS — I2510 Atherosclerotic heart disease of native coronary artery without angina pectoris: Secondary | ICD-10-CM | POA: Diagnosis not present

## 2023-08-30 DIAGNOSIS — R931 Abnormal findings on diagnostic imaging of heart and coronary circulation: Secondary | ICD-10-CM

## 2023-08-30 DIAGNOSIS — R6881 Early satiety: Secondary | ICD-10-CM | POA: Diagnosis not present

## 2023-08-30 DIAGNOSIS — G8928 Other chronic postprocedural pain: Secondary | ICD-10-CM | POA: Diagnosis not present

## 2023-08-30 DIAGNOSIS — I10 Essential (primary) hypertension: Secondary | ICD-10-CM

## 2023-08-30 DIAGNOSIS — M545 Low back pain, unspecified: Secondary | ICD-10-CM

## 2023-08-30 DIAGNOSIS — R1084 Generalized abdominal pain: Secondary | ICD-10-CM | POA: Diagnosis not present

## 2023-08-30 DIAGNOSIS — E782 Mixed hyperlipidemia: Secondary | ICD-10-CM

## 2023-08-30 DIAGNOSIS — Z23 Encounter for immunization: Secondary | ICD-10-CM

## 2023-08-30 DIAGNOSIS — M5010 Cervical disc disorder with radiculopathy, unspecified cervical region: Secondary | ICD-10-CM

## 2023-08-30 DIAGNOSIS — I77810 Thoracic aortic ectasia: Secondary | ICD-10-CM | POA: Diagnosis not present

## 2023-08-30 DIAGNOSIS — G8929 Other chronic pain: Secondary | ICD-10-CM

## 2023-08-30 DIAGNOSIS — Z8781 Personal history of (healed) traumatic fracture: Secondary | ICD-10-CM

## 2023-08-30 DIAGNOSIS — F1190 Opioid use, unspecified, uncomplicated: Secondary | ICD-10-CM

## 2023-08-30 LAB — LIPID PANEL
Cholesterol: 130 mg/dL (ref 0–200)
HDL: 46.3 mg/dL (ref >39.00)
LDL Cholesterol: 16 mg/dL (ref 0–99)
NonHDL: 83.68
Total CHOL/HDL Ratio: 3
Triglycerides: 338 mg/dL — ABNORMAL HIGH (ref 0.0–149.0)
VLDL: 67.6 mg/dL — ABNORMAL HIGH (ref 0.0–40.0)

## 2023-08-30 LAB — CBC
HCT: 39.7 % (ref 39.0–52.0)
Hemoglobin: 13.4 g/dL (ref 13.0–17.0)
MCHC: 33.7 g/dL (ref 30.0–36.0)
MCV: 99 fL (ref 78.0–100.0)
Platelets: 166 10*3/uL (ref 150.0–400.0)
RBC: 4.01 Mil/uL — ABNORMAL LOW (ref 4.22–5.81)
RDW: 13.3 % (ref 11.5–15.5)
WBC: 5.3 10*3/uL (ref 4.0–10.5)

## 2023-08-30 LAB — COMPREHENSIVE METABOLIC PANEL
ALT: 34 U/L (ref 0–53)
AST: 49 U/L — ABNORMAL HIGH (ref 0–37)
Albumin: 4.2 g/dL (ref 3.5–5.2)
Alkaline Phosphatase: 54 U/L (ref 39–117)
BUN: 13 mg/dL (ref 6–23)
CO2: 27 meq/L (ref 19–32)
Calcium: 8.9 mg/dL (ref 8.4–10.5)
Chloride: 100 meq/L (ref 96–112)
Creatinine, Ser: 0.85 mg/dL (ref 0.40–1.50)
GFR: 88.43 mL/min (ref >60.00)
Glucose, Bld: 86 mg/dL (ref 70–99)
Potassium: 5.2 meq/L — ABNORMAL HIGH (ref 3.5–5.1)
Sodium: 135 meq/L (ref 135–145)
Total Bilirubin: 0.7 mg/dL (ref 0.2–1.2)
Total Protein: 6.3 g/dL (ref 6.0–8.3)

## 2023-08-30 LAB — TSH: TSH: 1.13 u[IU]/mL (ref 0.35–5.50)

## 2023-08-30 MED ORDER — BENAZEPRIL HCL 40 MG PO TABS: 40.0000 mg | ORAL_TABLET | Freq: Every day | ORAL | 1 refills | Status: DC

## 2023-08-30 MED ORDER — PREDNISONE 5 MG PO TABS: 2.5000 mg | ORAL_TABLET | Freq: Every day | ORAL | 1 refills | Status: DC

## 2023-08-30 MED ORDER — OXYCODONE-ACETAMINOPHEN 10-325 MG PO TABS: 1.0000 | ORAL_TABLET | Freq: Three times a day (TID) | ORAL | 0 refills | Status: DC

## 2023-08-30 MED ORDER — PANTOPRAZOLE SODIUM 40 MG PO TBEC: 40.0000 mg | DELAYED_RELEASE_TABLET | Freq: Every day | ORAL | 1 refills | Status: DC

## 2023-08-30 MED ORDER — DICLOFENAC SODIUM ER 100 MG PO TB24: 100.0000 mg | ORAL_TABLET | Freq: Every day | ORAL | 1 refills | Status: DC

## 2023-08-30 MED ORDER — PREGABALIN 150 MG PO CAPS: 150.0000 mg | ORAL_CAPSULE | Freq: Two times a day (BID) | ORAL | 1 refills | Status: DC

## 2023-08-30 MED ORDER — LOVASTATIN 40 MG PO TABS: 80.0000 mg | ORAL_TABLET | Freq: Every day | ORAL | 3 refills | Status: DC

## 2023-08-30 MED ORDER — METAXALONE 800 MG PO TABS: 800.0000 mg | ORAL_TABLET | Freq: Three times a day (TID) | ORAL | 2 refills | Status: DC

## 2023-08-30 NOTE — Patient Instructions (Addendum)
Return in about 11 weeks (around 11/15/2023).        Great to see you today.  I have refilled the medication(s) we provide.   If labs were collected or images ordered, we will inform you of  results once we have received them and reviewed. We will contact you either by echart message, or telephone call.  Please give ample time to the testing facility, and our office to run,  receive and review results. Please do not call inquiring of results, even if you can see them in your chart. We will contact you as soon as we are able. If it has been over 1 week since the test was completed, and you have not yet heard from [place], then please call us.    - echart message- for normal results that have been seen by the patient already.   - telephone call: abnormal results or if patient has not viewed results in their echart.  If a referral to a specialist was entered for you, please call us in 2 weeks if you have not heard from the specialist office to schedule.
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
## 2023-08-31 ENCOUNTER — Other Ambulatory Visit: Payer: Self-pay | Admitting: Family Medicine

## 2023-08-31 MED ORDER — LOVASTATIN 40 MG PO TABS: 40.0000 mg | ORAL_TABLET | Freq: Every day | ORAL | 3 refills | Status: DC
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
## 2023-09-01 ENCOUNTER — Encounter: Payer: Medicare Other | Admitting: Urology

## 2023-09-01 DIAGNOSIS — N281 Cyst of kidney, acquired: Secondary | ICD-10-CM

## 2023-09-01 DIAGNOSIS — N401 Enlarged prostate with lower urinary tract symptoms: Secondary | ICD-10-CM

## 2023-09-02 LAB — DRUG MONITORING PANEL 376104, URINE
Amphetamines: NEGATIVE ng/mL (ref <500)
Barbiturates: NEGATIVE ng/mL (ref <300)
Benzodiazepines: NEGATIVE ng/mL (ref <100)
Cocaine Metabolite: NEGATIVE ng/mL (ref <150)
Codeine: NEGATIVE ng/mL (ref <50)
Desmethyltramadol: NEGATIVE ng/mL (ref <100)
Hydrocodone: NEGATIVE ng/mL (ref <50)
Hydromorphone: NEGATIVE ng/mL (ref <50)
Morphine: NEGATIVE ng/mL (ref <50)
Norhydrocodone: NEGATIVE ng/mL (ref <50)
Noroxycodone: 2083 ng/mL — ABNORMAL HIGH (ref <50)
Opiates: NEGATIVE ng/mL (ref <100)
Oxycodone: 776 ng/mL — ABNORMAL HIGH (ref <50)
Oxycodone: POSITIVE ng/mL — AB (ref <100)
Oxymorphone: 503 ng/mL — ABNORMAL HIGH (ref <50)
Tramadol: NEGATIVE ng/mL (ref <100)

## 2023-09-02 LAB — DM TEMPLATE

## 2023-09-23 ENCOUNTER — Other Ambulatory Visit: Payer: Self-pay | Admitting: Family Medicine

## 2023-11-15 ENCOUNTER — Ambulatory Visit: Payer: Medicare Other | Admitting: Family Medicine

## 2023-11-15 ENCOUNTER — Encounter: Payer: Self-pay | Admitting: Family Medicine

## 2023-11-15 VITALS — BP 112/70 | HR 79 | Temp 97.9°F | Wt 170.2 lb

## 2023-11-15 DIAGNOSIS — I2510 Atherosclerotic heart disease of native coronary artery without angina pectoris: Secondary | ICD-10-CM

## 2023-11-15 DIAGNOSIS — I10 Essential (primary) hypertension: Secondary | ICD-10-CM | POA: Diagnosis not present

## 2023-11-15 DIAGNOSIS — G8929 Other chronic pain: Secondary | ICD-10-CM

## 2023-11-15 DIAGNOSIS — G8928 Other chronic postprocedural pain: Secondary | ICD-10-CM

## 2023-11-15 DIAGNOSIS — Z8781 Personal history of (healed) traumatic fracture: Secondary | ICD-10-CM

## 2023-11-15 DIAGNOSIS — I77810 Thoracic aortic ectasia: Secondary | ICD-10-CM

## 2023-11-15 DIAGNOSIS — R931 Abnormal findings on diagnostic imaging of heart and coronary circulation: Secondary | ICD-10-CM

## 2023-11-15 DIAGNOSIS — E782 Mixed hyperlipidemia: Secondary | ICD-10-CM

## 2023-11-15 DIAGNOSIS — M5010 Cervical disc disorder with radiculopathy, unspecified cervical region: Secondary | ICD-10-CM | POA: Diagnosis not present

## 2023-11-15 DIAGNOSIS — F1190 Opioid use, unspecified, uncomplicated: Secondary | ICD-10-CM

## 2023-11-15 DIAGNOSIS — M545 Low back pain, unspecified: Secondary | ICD-10-CM

## 2023-11-15 DIAGNOSIS — M1990 Unspecified osteoarthritis, unspecified site: Secondary | ICD-10-CM

## 2023-11-15 DIAGNOSIS — M542 Cervicalgia: Secondary | ICD-10-CM

## 2023-11-15 DIAGNOSIS — Z79899 Other long term (current) drug therapy: Secondary | ICD-10-CM

## 2023-11-15 MED ORDER — PREDNISONE 1 MG PO TABS
1.0000 mg | ORAL_TABLET | Freq: Every day | ORAL | 1 refills | Status: DC
Start: 2023-11-15 — End: 2024-01-27

## 2023-11-15 MED ORDER — DICLOFENAC SODIUM ER 100 MG PO TB24: 100.0000 mg | ORAL_TABLET | Freq: Every day | ORAL | 1 refills | Status: DC

## 2023-11-15 MED ORDER — OXYCODONE-ACETAMINOPHEN 10-325 MG PO TABS: 1.0000 | ORAL_TABLET | Freq: Three times a day (TID) | ORAL | 0 refills | Status: DC

## 2023-11-15 MED ORDER — PANTOPRAZOLE SODIUM 40 MG PO TBEC: 40.0000 mg | DELAYED_RELEASE_TABLET | Freq: Every day | ORAL | 1 refills | Status: DC

## 2023-11-15 MED ORDER — PREGABALIN 150 MG PO CAPS: 150.0000 mg | ORAL_CAPSULE | Freq: Two times a day (BID) | ORAL | 1 refills | Status: DC

## 2023-11-15 MED ORDER — METAXALONE 800 MG PO TABS: 800.0000 mg | ORAL_TABLET | Freq: Three times a day (TID) | ORAL | 2 refills | Status: DC
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
## 2023-11-15 NOTE — Patient Instructions (Addendum)

## 2023-12-08 ENCOUNTER — Other Ambulatory Visit: Payer: Self-pay

## 2023-12-08 MED ORDER — BENAZEPRIL HCL 40 MG PO TABS: 40.0000 mg | ORAL_TABLET | Freq: Every day | ORAL | 1 refills | Status: DC

## 2023-12-22 ENCOUNTER — Telehealth: Payer: Self-pay

## 2023-12-22 NOTE — Telephone Encounter (Signed)
 Pt is to call office back with pharmacy that has in stock or possibly have lower dose in stock. Pt states he usually gets 3 m/s at a time

## 2023-12-22 NOTE — Telephone Encounter (Signed)
 Copied from CRM [id number]. Topic: Clinical - Prescription Issue >> Dec 22, 2023  2:28 PM Fonda Kinder J wrote: Reason for CRM: Pt states the medication he was prescribed for pain isn't in stock at neither of his preferred pharmacies. He is requesting an alternative prescription be sent for his OxyCodone 10-325mg . Pt is requesting a callback for a f/u, he doesn't have access to his my chart at the moment

## 2024-01-27 ENCOUNTER — Encounter: Payer: Self-pay | Admitting: Family Medicine

## 2024-01-27 ENCOUNTER — Ambulatory Visit: Payer: Medicare Other | Admitting: Family Medicine

## 2024-01-27 VITALS — BP 112/74 | HR 73 | Temp 98.1°F | Wt 166.0 lb

## 2024-01-27 DIAGNOSIS — M545 Low back pain, unspecified: Secondary | ICD-10-CM

## 2024-01-27 DIAGNOSIS — M542 Cervicalgia: Secondary | ICD-10-CM

## 2024-01-27 DIAGNOSIS — Z79899 Other long term (current) drug therapy: Secondary | ICD-10-CM

## 2024-01-27 DIAGNOSIS — R931 Abnormal findings on diagnostic imaging of heart and coronary circulation: Secondary | ICD-10-CM

## 2024-01-27 DIAGNOSIS — I10 Essential (primary) hypertension: Secondary | ICD-10-CM

## 2024-01-27 DIAGNOSIS — Z8781 Personal history of (healed) traumatic fracture: Secondary | ICD-10-CM

## 2024-01-27 DIAGNOSIS — F5101 Primary insomnia: Secondary | ICD-10-CM

## 2024-01-27 DIAGNOSIS — M5010 Cervical disc disorder with radiculopathy, unspecified cervical region: Secondary | ICD-10-CM

## 2024-01-27 DIAGNOSIS — E782 Mixed hyperlipidemia: Secondary | ICD-10-CM

## 2024-01-27 DIAGNOSIS — G8928 Other chronic postprocedural pain: Secondary | ICD-10-CM

## 2024-01-27 DIAGNOSIS — I2510 Atherosclerotic heart disease of native coronary artery without angina pectoris: Secondary | ICD-10-CM

## 2024-01-27 DIAGNOSIS — F1190 Opioid use, unspecified, uncomplicated: Secondary | ICD-10-CM

## 2024-01-27 DIAGNOSIS — M1990 Unspecified osteoarthritis, unspecified site: Secondary | ICD-10-CM

## 2024-01-27 DIAGNOSIS — G8929 Other chronic pain: Secondary | ICD-10-CM

## 2024-01-27 DIAGNOSIS — I77810 Thoracic aortic ectasia: Secondary | ICD-10-CM

## 2024-01-27 MED ORDER — PREDNISONE 1 MG PO TABS: 1.0000 mg | ORAL_TABLET | Freq: Every day | ORAL | 1 refills | Status: DC

## 2024-01-27 MED ORDER — PREGABALIN 150 MG PO CAPS: 150.0000 mg | ORAL_CAPSULE | Freq: Two times a day (BID) | ORAL | 1 refills | Status: DC

## 2024-01-27 MED ORDER — DICLOFENAC SODIUM ER 100 MG PO TB24: 100.0000 mg | ORAL_TABLET | Freq: Every day | ORAL | 1 refills | Status: DC

## 2024-01-27 MED ORDER — OXYCODONE-ACETAMINOPHEN 10-325 MG PO TABS: 1.0000 | ORAL_TABLET | Freq: Three times a day (TID) | ORAL | 0 refills | Status: DC

## 2024-01-27 MED ORDER — PANTOPRAZOLE SODIUM 40 MG PO TBEC: 40.0000 mg | DELAYED_RELEASE_TABLET | Freq: Every day | ORAL | 1 refills | Status: DC

## 2024-01-27 MED ORDER — MIRTAZAPINE 15 MG PO TABS: 15.0000 mg | ORAL_TABLET | Freq: Every day | ORAL | 1 refills | Status: DC

## 2024-01-27 MED ORDER — BENAZEPRIL HCL 40 MG PO TABS: 40.0000 mg | ORAL_TABLET | Freq: Every day | ORAL | 1 refills | Status: DC
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
## 2024-01-27 NOTE — Patient Instructions (Addendum)
 Return in about 11 weeks (around 04/13/2024).        Great to see you today.  I have refilled the medication(s) we provide.   If labs were collected or images ordered, we will inform you of  results once we have received them and reviewed. We will contact you either by echart message, or telephone call.  Please give ample time to the testing facility, and our office to run,  receive and review results. Please do not call inquiring of results, even if you can see them in your chart. We will contact you as soon as we are able. If it has been over 1 week since the test was completed, and you have not yet heard from [place], then please call us.    - echart message- for normal results that have been seen by the patient already.   - telephone call: abnormal results or if patient has not viewed results in their echart.  If a referral to a specialist was entered for you, please call us in 2 weeks if you have not heard from the specialist office to schedule.

## 2024-02-09 DIAGNOSIS — M25511 Pain in right shoulder: Secondary | ICD-10-CM | POA: Diagnosis not present

## 2024-02-09 DIAGNOSIS — M542 Cervicalgia: Secondary | ICD-10-CM | POA: Diagnosis not present

## 2024-02-10 ENCOUNTER — Emergency Department (HOSPITAL_COMMUNITY)

## 2024-02-10 ENCOUNTER — Emergency Department (HOSPITAL_COMMUNITY)
Admission: EM | Admit: 2024-02-10 | Discharge: 2024-02-10 | Disposition: A | Discharge status: Home / Self Care | Attending: Emergency Medicine | Admitting: Emergency Medicine

## 2024-02-10 ENCOUNTER — Encounter (HOSPITAL_COMMUNITY): Payer: Self-pay

## 2024-02-10 DIAGNOSIS — I10 Essential (primary) hypertension: Secondary | ICD-10-CM | POA: Diagnosis not present

## 2024-02-10 DIAGNOSIS — M542 Cervicalgia: Secondary | ICD-10-CM | POA: Diagnosis not present

## 2024-02-10 DIAGNOSIS — S12501A Unspecified nondisplaced fracture of sixth cervical vertebra, initial encounter for closed fracture: Secondary | ICD-10-CM | POA: Diagnosis not present

## 2024-02-10 DIAGNOSIS — W1839XA Other fall on same level, initial encounter: Secondary | ICD-10-CM | POA: Diagnosis not present

## 2024-02-10 DIAGNOSIS — S12101D Unspecified nondisplaced fracture of second cervical vertebra, subsequent encounter for fracture with routine healing: Secondary | ICD-10-CM | POA: Diagnosis not present

## 2024-02-10 DIAGNOSIS — S12500D Unspecified displaced fracture of sixth cervical vertebra, subsequent encounter for fracture with routine healing: Secondary | ICD-10-CM | POA: Diagnosis not present

## 2024-02-10 DIAGNOSIS — M47812 Spondylosis without myelopathy or radiculopathy, cervical region: Secondary | ICD-10-CM | POA: Diagnosis not present

## 2024-02-10 DIAGNOSIS — R531 Weakness: Secondary | ICD-10-CM | POA: Insufficient documentation

## 2024-02-10 DIAGNOSIS — M25511 Pain in right shoulder: Secondary | ICD-10-CM | POA: Insufficient documentation

## 2024-02-10 DIAGNOSIS — Z79899 Other long term (current) drug therapy: Secondary | ICD-10-CM | POA: Diagnosis not present

## 2024-02-10 DIAGNOSIS — M25512 Pain in left shoulder: Secondary | ICD-10-CM | POA: Insufficient documentation

## 2024-02-10 DIAGNOSIS — S199XXA Unspecified injury of neck, initial encounter: Secondary | ICD-10-CM | POA: Diagnosis present

## 2024-02-10 DIAGNOSIS — S12600D Unspecified displaced fracture of seventh cervical vertebra, subsequent encounter for fracture with routine healing: Secondary | ICD-10-CM | POA: Diagnosis not present

## 2024-02-10 MED ORDER — FENTANYL CITRATE PF 50 MCG/ML IJ SOSY
50.0000 ug | PREFILLED_SYRINGE | Freq: Once | INTRAMUSCULAR | Status: AC
  Administered 2024-02-10: 50 ug via INTRAVENOUS
  Filled 2024-02-10: qty 1

## 2024-02-10 NOTE — ED Notes (Signed)
Patient walked to restroom

## 2024-02-10 NOTE — ED Provider Notes (Signed)
 [place] EMERGENCY DEPARTMENT AT [facility] Provider Note   CSN: [id number] Arrival date & time: 02/10/24  1922     History  No chief complaint on file.  HPI  Brian Murray is a 73 y.o. male with past medical history hypertension, hyperlipidemia, cervical disc disease and prior cervical fusion presents from orthopedic clinic due to C6 fracture.  Patient states that 2 weeks ago exactly, he had a ground-level fall where he felt pain to his neck.  He began having bilateral shoulder pain, which he has at baseline but worsened after his fall.  He also began having right arm weakness, which prompted him to see an orthopedic doctor.  He also had another fall about 3 days ago that made the symptoms worse.  Today, he had an MRI done that demonstrated C6 fracture, was told to present here by Dr. Mildred All for further evaluation by on-call spine doctor.  He denies any urinary or bowel symptoms, no saddle anesthesia.  HPI     Home Medications Prior to Admission medications   Medication Sig Start Date End Date Taking? Authorizing Provider  benazepril  (LOTENSIN ) 40 MG tablet Take 1 tablet (40 mg total) by mouth daily. 01/27/24   Kuneff, Renee A, DO  Diclofenac  Sodium CR 100 MG 24 hr tablet Take 1 tablet (100 mg total) by mouth daily. 01/27/24   Kuneff, Renee A, DO  lovastatin  (MEVACOR ) 40 MG tablet Take 1 tablet (40 mg total) by mouth at bedtime. 08/31/23   Kuneff, Renee A, DO  mirtazapine  (REMERON ) 15 MG tablet Take 1 tablet (15 mg total) by mouth at bedtime. 01/27/24   Kuneff, Renee A, DO  oxyCODONE -acetaminophen  (PERCOCET) 10-325 MG tablet Take 1 tablet by mouth every 8 (eight) hours. 01/27/24   Kuneff, Renee A, DO  pantoprazole  (PROTONIX ) 40 MG tablet Take 1 tablet (40 mg total) by mouth daily. 01/27/24   Kuneff, Renee A, DO  predniSONE  (DELTASONE ) 1 MG tablet Take 1-2 tablets (1-2 mg total) by mouth daily with breakfast. 01/27/24   Kuneff, Renee A, DO  pregabalin  (LYRICA ) 150 MG capsule Take  1 capsule (150 mg total) by mouth 2 (two) times daily. 01/27/24   Kuneff, Renee A, DO  silodosin  (RAPAFLO ) 8 MG CAPS capsule Take 1 capsule (8 mg total) by mouth daily after supper. 06/02/23   Scarlet Curly, MD      Allergies    Other    Review of Systems   Review of Systems  Physical Exam Updated Vital Signs BP 133/84 (BP Location: Right Arm)   Pulse 88   Temp 98.2 F (36.8 C) (Oral)   Resp 14   SpO2 100%  Physical Exam Vitals and nursing note reviewed.  Constitutional:      General: He is not in acute distress.    Appearance: He is well-developed.  HENT:     Head: Normocephalic and atraumatic.  Eyes:     Conjunctiva/sclera: Conjunctivae normal.  Cardiovascular:     Rate and Rhythm: Normal rate and regular rhythm.     Heart sounds: No murmur heard. Pulmonary:     Effort: Pulmonary effort is normal. No respiratory distress.     Breath sounds: Normal breath sounds.  Abdominal:     Palpations: Abdomen is soft.  Musculoskeletal:     Cervical back: Neck supple.  Skin:    General: Skin is warm and dry.     Capillary Refill: Capillary refill takes less than 2 seconds.  Neurological:  Mental Status: He is alert.     Comments: Grip strength decreased in the right upper extremity.  Right side 3/5 strength with elbow and shoulder flexion and extension compared to left side, subjective paresthesias to the right 4th and 5th digit.  5/5 strength in bilateral lower extremities.  Pain to palpation of the C-spine  Psychiatric:        Mood and Affect: Mood normal.     ED Results / Procedures / Treatments   Labs (all labs ordered are listed, but only abnormal results are displayed) Labs Reviewed - No data to display  EKG None  Radiology CT Cervical Spine Wo Contrast Result Date: 02/10/2024 CLINICAL DATA:  C6 fracture. Fall 2 weeks ago. Fall 3 days ago. Right arm weakness. MRI cervical spine done at outside hospital today. EXAM: CT CERVICAL SPINE WITHOUT CONTRAST TECHNIQUE:  Multidetector CT imaging of the cervical spine was performed without intravenous contrast. Multiplanar CT image reconstructions were also generated. RADIATION DOSE REDUCTION: This exam was performed according to the departmental dose-optimization program which includes automated exposure control, adjustment of the mA and/or kV according to patient size and/or use of iterative reconstruction technique. COMPARISON:  MRI cervical spine 08/10/2016 FINDINGS: Alignment: Normal. Skull base and vertebrae: Marked osseous fusion and surgical hardware fusion of the spinous processes extending from the C2 to the C7 levels. Severe multilevel degenerative changes. Associated severe osseous neural foraminal stenosis at the C6-C7 neural foramina. Acute displaced fracture of the fused spinous processes at the C6 and C7 levels. Interval worsening of compression fracture of the C6 vertebral body with least 70% vertebral body height loss. Acute minimally displaced C7 transverse process fractures. No aggressive appearing focal osseous lesion or focal pathologic process. Soft tissues and spinal canal: No prevertebral fluid or swelling. No visible canal hematoma. Upper chest: Unremarkable. Other: None. IMPRESSION: 1. Acute displaced fracture of the fused spinous processes at the C6 and C7 levels. 2. Interval worsening of a prior compression fracture of the C6 vertebral body with least 70% vertebral body height loss. 3. Acute minimally displaced C7 transverse process fractures. 4. Marked osseous fusion and surgical hardware fusion of the spinous processes extending from the C2 to the C7 levels. Severe multilevel degenerative changes. Associated severe osseous neural foraminal stenosis at the C6-C7 neural foramina. Electronically Signed   By: Morgane  Naveau M.D.   On: 02/10/2024 22:13    Procedures Procedures    Medications Ordered in ED Medications  fentaNYL  (SUBLIMAZE ) injection 50 mcg (50 mcg Intravenous Given 02/10/24 2122)     ED Course/ Medical Decision Making/ A&P                                 Medical Decision Making Amount and/or Complexity of Data Reviewed Labs: ordered. Radiology: ordered.  Risk Prescription drug management.   Patient is alert, afebrile, and hemodynamically stable in no acute distress.  Physical exam as noted above.  I am unable to view MRI imaging at this time.  Of note, patient is wearing a soft collar at this time.  Will switch with hard collar.  I spoke with neurosurgery team on-call Sabra Cramp) this evening, who is aware of the patient from Dr.Beane, do not believe they require any emergent intervention or admission from their standpoint given that his injury and these symptoms have been present for 2 weeks.  Agree with hard collar, would also like CT imaging to review.  They do not  need formal radiology read to result prior to patient's discharge, they will follow-up on this and arrange for follow-up outpatient tomorrow.  I spoke with patient and his wife at bedside about this, and they are agreeable to this plan.  In the interim, patient received IV fentanyl  for pain control.  He takes opioids at home for chronic pain, has this available for himself.  Also given a prednisone  prescription by [facility], which neurosurgery is in agreement with.  CT imaging was performed, and I spoke with radiologist and who was concerned that these are more acute fractures.  I reconnected with neurosurgery, they plan to see the patient tomorrow morning at 10 AM and are still okay with discharge.  Patient was discharged in stable condition with information placed in AVS for close neurosurgery follow-up.  Patient seen in conjunction with Dr. Manus Sellers, who agreed with the above work-up and plan of care.        Final Clinical Impression(s) / ED Diagnoses Final diagnoses:  Closed nondisplaced fracture of sixth cervical vertebra, unspecified fracture morphology, initial encounter [facility])    Rx / DC  Orders ED Discharge Orders     None         Lorain Robson, MD 02/10/24 2223    Tegeler, Marine Sia, MD 02/11/24 0006

## 2024-02-10 NOTE — Discharge Instructions (Addendum)
 You were seen in the ED today for neurosurgery consultation for neck fracture.  Please follow-up with them tomorrow at 10 AM.  Contact information is in the AVS.  Please continue wearing hard collar until you are seen by them.
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
## 2024-02-11 ENCOUNTER — Encounter (HOSPITAL_COMMUNITY): Payer: Self-pay | Admitting: Neurosurgery

## 2024-02-11 ENCOUNTER — Other Ambulatory Visit: Payer: Self-pay

## 2024-02-11 ENCOUNTER — Other Ambulatory Visit: Payer: Self-pay | Admitting: Neurosurgery

## 2024-02-11 DIAGNOSIS — S12590A Other displaced fracture of sixth cervical vertebra, initial encounter for closed fracture: Secondary | ICD-10-CM | POA: Diagnosis not present

## 2024-02-11 DIAGNOSIS — S12500G Unspecified displaced fracture of sixth cervical vertebra, subsequent encounter for fracture with delayed healing: Secondary | ICD-10-CM | POA: Diagnosis not present

## 2024-02-11 DIAGNOSIS — M542 Cervicalgia: Secondary | ICD-10-CM | POA: Diagnosis not present

## 2024-02-11 NOTE — Progress Notes (Signed)
 SDW call  Patient was given pre-op instructions over the phone. Patient verbalized understanding of instructions provided.     PCP - Dr. Napolean Backbone Cardiologist - denies Pulmonary:    PPM/ICD - denies Device Orders - na Rep Notified - na   Chest x-ray - na EKG -  DOS, 02/14/2024 Stress Test -01/15/2022 ECHO - 12/06/2019 Cardiac Cath -   Sleep Study/sleep apnea/CPAP: denies  Non-diabetic  Blood Thinner Instructions: denies  Aspirin Instructions:denies   ERAS Protcol - NPO   Anesthesia review: No   Patient denies shortness of breath, fever, cough and chest pain over the phone call  Your procedure is scheduled on Monday, February 14, 2024  Report to [facility] Main Entrance "A" at  1350 PM, then check in with the Admitting office.  Call this number if you have problems the morning of surgery:  [phone]   If you have any questions prior to your surgery date call [phone]: Open Monday-Friday 8am-4pm If you experience any cold or flu symptoms such as cough, fever, chills, shortness of breath, etc. between now and your scheduled surgery, please notify us  at the above number     Remember:  Do not eat or drink after midnight the night before your surgery  Take these medicines the morning of surgery with A SIP OF WATER:  Protonix , prednisone , lyrica   As needed: percocet  As of today, STOP taking any Aspirin (unless otherwise instructed by your surgeon) Aleve, Naproxen, Ibuprofen, Motrin, Advil, Goody's, BC's, all herbal medications, fish oil, and all vitamins.

## 2024-02-14 ENCOUNTER — Other Ambulatory Visit: Payer: Self-pay

## 2024-02-14 ENCOUNTER — Inpatient Hospital Stay (HOSPITAL_COMMUNITY)

## 2024-02-14 ENCOUNTER — Inpatient Hospital Stay (HOSPITAL_COMMUNITY): Admitting: Certified Registered Nurse Anesthetist

## 2024-02-14 ENCOUNTER — Encounter (HOSPITAL_COMMUNITY)
Admission: RE | Disposition: A | Discharge status: Home / Self Care | Payer: Self-pay | Source: Home / Self Care | Attending: Neurosurgery

## 2024-02-14 ENCOUNTER — Inpatient Hospital Stay (HOSPITAL_COMMUNITY)
Admission: RE | Admit: 2024-02-14 | Discharge: 2024-02-22 | DRG: 430 | Disposition: A | Discharge status: Home / Self Care | Attending: Neurosurgery | Admitting: Neurosurgery

## 2024-02-14 ENCOUNTER — Encounter (HOSPITAL_COMMUNITY): Payer: Self-pay | Admitting: Neurosurgery

## 2024-02-14 DIAGNOSIS — Z981 Arthrodesis status: Secondary | ICD-10-CM

## 2024-02-14 DIAGNOSIS — E871 Hypo-osmolality and hyponatremia: Secondary | ICD-10-CM | POA: Diagnosis not present

## 2024-02-14 DIAGNOSIS — I10 Essential (primary) hypertension: Secondary | ICD-10-CM | POA: Diagnosis not present

## 2024-02-14 DIAGNOSIS — Z7952 Long term (current) use of systemic steroids: Secondary | ICD-10-CM

## 2024-02-14 DIAGNOSIS — S12500A Unspecified displaced fracture of sixth cervical vertebra, initial encounter for closed fracture: Secondary | ICD-10-CM | POA: Diagnosis not present

## 2024-02-14 DIAGNOSIS — S12590A Other displaced fracture of sixth cervical vertebra, initial encounter for closed fracture: Secondary | ICD-10-CM | POA: Diagnosis not present

## 2024-02-14 DIAGNOSIS — Z79899 Other long term (current) drug therapy: Secondary | ICD-10-CM

## 2024-02-14 DIAGNOSIS — G9589 Other specified diseases of spinal cord: Secondary | ICD-10-CM | POA: Diagnosis not present

## 2024-02-14 DIAGNOSIS — Z79891 Long term (current) use of opiate analgesic: Secondary | ICD-10-CM

## 2024-02-14 DIAGNOSIS — W19XXXA Unspecified fall, initial encounter: Secondary | ICD-10-CM | POA: Diagnosis present

## 2024-02-14 DIAGNOSIS — G992 Myelopathy in diseases classified elsewhere: Secondary | ICD-10-CM | POA: Diagnosis not present

## 2024-02-14 DIAGNOSIS — I2510 Atherosclerotic heart disease of native coronary artery without angina pectoris: Secondary | ICD-10-CM | POA: Diagnosis not present

## 2024-02-14 DIAGNOSIS — M5412 Radiculopathy, cervical region: Secondary | ICD-10-CM | POA: Diagnosis not present

## 2024-02-14 DIAGNOSIS — S12590G Other displaced fracture of sixth cervical vertebra, subsequent encounter for fracture with delayed healing: Secondary | ICD-10-CM | POA: Diagnosis not present

## 2024-02-14 DIAGNOSIS — Z8249 Family history of ischemic heart disease and other diseases of the circulatory system: Secondary | ICD-10-CM

## 2024-02-14 DIAGNOSIS — E785 Hyperlipidemia, unspecified: Secondary | ICD-10-CM | POA: Diagnosis not present

## 2024-02-14 DIAGNOSIS — S13170A Subluxation of C6/C7 cervical vertebrae, initial encounter: Secondary | ICD-10-CM | POA: Diagnosis not present

## 2024-02-14 DIAGNOSIS — Z7982 Long term (current) use of aspirin: Secondary | ICD-10-CM

## 2024-02-14 HISTORY — PX: ANTERIOR CERVICAL CORPECTOMY: SHX1159

## 2024-02-14 LAB — BASIC METABOLIC PANEL WITH GFR
Anion gap: 12 (ref 5–15)
BUN: 8 mg/dL (ref 8–23)
CO2: 21 mmol/L — ABNORMAL LOW (ref 22–32)
Calcium: 9.2 mg/dL (ref 8.9–10.3)
Chloride: 98 mmol/L (ref 98–111)
Creatinine, Ser: 0.63 mg/dL (ref 0.61–1.24)
GFR, Estimated: >60 mL/min (ref >60)
Glucose, Bld: 106 mg/dL — ABNORMAL HIGH (ref 70–99)
Potassium: 4.6 mmol/L (ref 3.5–5.1)
Sodium: 131 mmol/L — ABNORMAL LOW (ref 135–145)

## 2024-02-14 LAB — SURGICAL PCR SCREEN
MRSA, PCR: NEGATIVE
Staphylococcus aureus: POSITIVE — AB

## 2024-02-14 LAB — TYPE AND SCREEN
ABO/RH(D): O POS
Antibody Screen: NEGATIVE

## 2024-02-14 LAB — CBC
HCT: 42.3 % (ref 39.0–52.0)
Hemoglobin: 14.2 g/dL (ref 13.0–17.0)
MCH: 32 pg (ref 26.0–34.0)
MCHC: 33.6 g/dL (ref 30.0–36.0)
MCV: 95.3 fL (ref 80.0–100.0)
Platelets: 195 10*3/uL (ref 150–400)
RBC: 4.44 MIL/uL (ref 4.22–5.81)
RDW: 12.6 % (ref 11.5–15.5)
WBC: 9.4 10*3/uL (ref 4.0–10.5)
nRBC: 0 % (ref 0.0–0.2)

## 2024-02-14 SURGERY — ANTERIOR CERVICAL CORPECTOMY
Anesthesia: General | Site: Neck

## 2024-02-14 MED ORDER — ONDANSETRON HCL 4 MG/2ML IJ SOLN
4.0000 mg | Freq: Four times a day (QID) | INTRAMUSCULAR | Status: DC | PRN
  Administered 2024-02-16: 4 mg via INTRAVENOUS

## 2024-02-14 MED ORDER — BENAZEPRIL HCL 20 MG PO TABS
40.0000 mg | ORAL_TABLET | Freq: Every day | ORAL | Status: DC
  Administered 2024-02-15 – 2024-02-19 (×3): 40 mg via ORAL
  Filled 2024-02-14 (×9): qty 2

## 2024-02-14 MED ORDER — ACETAMINOPHEN 650 MG RE SUPP: 650.0000 mg | RECTAL | Status: DC | PRN

## 2024-02-14 MED ORDER — ACETAMINOPHEN 500 MG PO TABS
1000.0000 mg | ORAL_TABLET | Freq: Four times a day (QID) | ORAL | Status: AC
  Administered 2024-02-14 – 2024-02-15 (×3): 1000 mg via ORAL
  Filled 2024-02-14 (×4): qty 2

## 2024-02-14 MED ORDER — MORPHINE SULFATE (PF) 2 MG/ML IV SOLN
2.0000 mg | INTRAVENOUS | Status: DC | PRN
  Administered 2024-02-15 – 2024-02-16 (×4): 4 mg via INTRAVENOUS
  Administered 2024-02-16: 2 mg via INTRAVENOUS
  Administered 2024-02-16 (×2): 4 mg via INTRAVENOUS
  Administered 2024-02-17 – 2024-02-19 (×5): 2 mg via INTRAVENOUS
  Administered 2024-02-21: 4 mg via INTRAVENOUS
  Filled 2024-02-14: qty 1
  Filled 2024-02-14 (×6): qty 2
  Filled 2024-02-14 (×5): qty 1
  Filled 2024-02-14: qty 2

## 2024-02-14 MED ORDER — BISACODYL 10 MG RE SUPP: 10.0000 mg | Freq: Every day | RECTAL | Status: DC | PRN

## 2024-02-14 MED ORDER — THROMBIN 5000 UNITS EX SOLR
OROMUCOSAL | Status: DC | PRN
  Administered 2024-02-14 (×2): 5 mL via TOPICAL

## 2024-02-14 MED ORDER — SODIUM CHLORIDE 0.9 % IV SOLN: INTRAVENOUS | Status: DC | PRN

## 2024-02-14 MED ORDER — ROCURONIUM BROMIDE 10 MG/ML (PF) SYRINGE
PREFILLED_SYRINGE | INTRAVENOUS | Status: DC | PRN
  Administered 2024-02-14 (×2): 20 mg via INTRAVENOUS
  Administered 2024-02-14: 60 mg via INTRAVENOUS
  Administered 2024-02-14: 20 mg via INTRAVENOUS

## 2024-02-14 MED ORDER — PANTOPRAZOLE SODIUM 40 MG IV SOLR
40.0000 mg | Freq: Every day | INTRAVENOUS | Status: DC
  Administered 2024-02-14 – 2024-02-15 (×2): 40 mg via INTRAVENOUS
  Filled 2024-02-14 (×2): qty 10

## 2024-02-14 MED ORDER — DEXAMETHASONE SODIUM PHOSPHATE 4 MG/ML IJ SOLN
4.0000 mg | Freq: Four times a day (QID) | INTRAMUSCULAR | Status: AC
  Filled 2024-02-14: qty 1

## 2024-02-14 MED ORDER — DOCUSATE SODIUM 100 MG PO CAPS
100.0000 mg | ORAL_CAPSULE | Freq: Two times a day (BID) | ORAL | Status: DC
  Administered 2024-02-14 – 2024-02-22 (×15): 100 mg via ORAL
  Filled 2024-02-14 (×15): qty 1

## 2024-02-14 MED ORDER — OXYCODONE HCL 5 MG PO TABS
5.0000 mg | ORAL_TABLET | ORAL | Status: DC | PRN
  Administered 2024-02-15 – 2024-02-20 (×18): 5 mg via ORAL
  Filled 2024-02-14 (×18): qty 1

## 2024-02-14 MED ORDER — THROMBIN 5000 UNITS EX KIT
PACK | CUTANEOUS | Status: AC
  Filled 2024-02-14: qty 1

## 2024-02-14 MED ORDER — ONDANSETRON HCL 4 MG/2ML IJ SOLN
INTRAMUSCULAR | Status: AC
  Filled 2024-02-14: qty 2

## 2024-02-14 MED ORDER — MENTHOL 3 MG MT LOZG: 1.0000 | LOZENGE | OROMUCOSAL | Status: DC | PRN

## 2024-02-14 MED ORDER — PROPOFOL 10 MG/ML IV BOLUS
INTRAVENOUS | Status: DC | PRN
  Administered 2024-02-14: 200 mg via INTRAVENOUS

## 2024-02-14 MED ORDER — SUGAMMADEX SODIUM 200 MG/2ML IV SOLN
INTRAVENOUS | Status: DC | PRN
  Administered 2024-02-14: 200 mg via INTRAVENOUS

## 2024-02-14 MED ORDER — ONDANSETRON HCL 4 MG/2ML IJ SOLN: 4.0000 mg | Freq: Once | INTRAMUSCULAR | Status: DC | PRN

## 2024-02-14 MED ORDER — BACITRACIN ZINC 500 UNIT/GM EX OINT
TOPICAL_OINTMENT | CUTANEOUS | Status: AC
  Filled 2024-02-14: qty 28.35

## 2024-02-14 MED ORDER — LACTATED RINGERS IV SOLN: INTRAVENOUS | Status: DC

## 2024-02-14 MED ORDER — PHENYLEPHRINE 80 MCG/ML (10ML) SYRINGE FOR IV PUSH (FOR BLOOD PRESSURE SUPPORT)
PREFILLED_SYRINGE | INTRAVENOUS | Status: DC | PRN
  Administered 2024-02-14: 80 ug via INTRAVENOUS
  Administered 2024-02-14: 160 ug via INTRAVENOUS

## 2024-02-14 MED ORDER — OXYCODONE HCL 5 MG PO TABS
ORAL_TABLET | ORAL | Status: AC
  Filled 2024-02-14: qty 2

## 2024-02-14 MED ORDER — PREGABALIN 75 MG PO CAPS
150.0000 mg | ORAL_CAPSULE | Freq: Two times a day (BID) | ORAL | Status: DC
  Administered 2024-02-14 – 2024-02-22 (×15): 150 mg via ORAL
  Filled 2024-02-14 (×15): qty 2

## 2024-02-14 MED ORDER — ACETAMINOPHEN 10 MG/ML IV SOLN
1000.0000 mg | Freq: Once | INTRAVENOUS | Status: DC | PRN
  Administered 2024-02-14: 1000 mg via INTRAVENOUS

## 2024-02-14 MED ORDER — FENTANYL CITRATE (PF) 100 MCG/2ML IJ SOLN
INTRAMUSCULAR | Status: AC
  Filled 2024-02-14: qty 2

## 2024-02-14 MED ORDER — CHLORHEXIDINE GLUCONATE 0.12 % MT SOLN
15.0000 mL | Freq: Once | OROMUCOSAL | Status: AC
  Administered 2024-02-14: 15 mL via OROMUCOSAL
  Filled 2024-02-14: qty 15

## 2024-02-14 MED ORDER — OXYCODONE HCL 5 MG PO TABS
10.0000 mg | ORAL_TABLET | Freq: Once | ORAL | Status: AC
  Administered 2024-02-14: 10 mg via ORAL

## 2024-02-14 MED ORDER — DEXAMETHASONE SODIUM PHOSPHATE 10 MG/ML IJ SOLN
INTRAMUSCULAR | Status: DC | PRN
  Administered 2024-02-14: 10 mg via INTRAVENOUS

## 2024-02-14 MED ORDER — CHLORHEXIDINE GLUCONATE CLOTH 2 % EX PADS: 6.0000 | MEDICATED_PAD | Freq: Once | CUTANEOUS | Status: DC

## 2024-02-14 MED ORDER — CEFAZOLIN SODIUM-DEXTROSE 2-4 GM/100ML-% IV SOLN
2.0000 g | INTRAVENOUS | Status: AC
  Administered 2024-02-14: 2 g via INTRAVENOUS
  Filled 2024-02-14: qty 100

## 2024-02-14 MED ORDER — KETAMINE HCL 10 MG/ML IJ SOLN
INTRAMUSCULAR | Status: DC | PRN
  Administered 2024-02-14: 20 mg via INTRAVENOUS
  Administered 2024-02-14: 10 mg via INTRAVENOUS

## 2024-02-14 MED ORDER — FENTANYL CITRATE (PF) 250 MCG/5ML IJ SOLN
INTRAMUSCULAR | Status: AC
  Filled 2024-02-14: qty 5

## 2024-02-14 MED ORDER — KETAMINE HCL 50 MG/5ML IJ SOSY
PREFILLED_SYRINGE | INTRAMUSCULAR | Status: AC
  Filled 2024-02-14: qty 5

## 2024-02-14 MED ORDER — AMISULPRIDE (ANTIEMETIC) 5 MG/2ML IV SOLN: 10.0000 mg | Freq: Once | INTRAVENOUS | Status: DC | PRN

## 2024-02-14 MED ORDER — CEFAZOLIN SODIUM-DEXTROSE 2-4 GM/100ML-% IV SOLN
2.0000 g | Freq: Three times a day (TID) | INTRAVENOUS | Status: AC
  Administered 2024-02-14 – 2024-02-15 (×2): 2 g via INTRAVENOUS
  Filled 2024-02-14 (×2): qty 100

## 2024-02-14 MED ORDER — LIDOCAINE 2% (20 MG/ML) 5 ML SYRINGE
INTRAMUSCULAR | Status: AC
  Filled 2024-02-14: qty 5

## 2024-02-14 MED ORDER — BACITRACIN ZINC 500 UNIT/GM EX OINT
TOPICAL_OINTMENT | CUTANEOUS | Status: DC | PRN
  Administered 2024-02-14: 1 via TOPICAL

## 2024-02-14 MED ORDER — LIDOCAINE HCL (CARDIAC) PF 100 MG/5ML IV SOSY
PREFILLED_SYRINGE | INTRAVENOUS | Status: DC | PRN
  Administered 2024-02-14: 60 mg via INTRAVENOUS

## 2024-02-14 MED ORDER — BUPIVACAINE-EPINEPHRINE (PF) 0.5% -1:200000 IJ SOLN
INTRAMUSCULAR | Status: AC
  Filled 2024-02-14: qty 30

## 2024-02-14 MED ORDER — PANTOPRAZOLE SODIUM 40 MG PO TBEC: 40.0000 mg | DELAYED_RELEASE_TABLET | Freq: Every day | ORAL | Status: DC

## 2024-02-14 MED ORDER — DEXAMETHASONE SODIUM PHOSPHATE 10 MG/ML IJ SOLN
INTRAMUSCULAR | Status: AC
  Filled 2024-02-14: qty 1

## 2024-02-14 MED ORDER — FENTANYL CITRATE (PF) 250 MCG/5ML IJ SOLN
INTRAMUSCULAR | Status: DC | PRN
  Administered 2024-02-14 (×2): 50 ug via INTRAVENOUS
  Administered 2024-02-14 (×2): 25 ug via INTRAVENOUS
  Administered 2024-02-14: 50 ug via INTRAVENOUS

## 2024-02-14 MED ORDER — MIRTAZAPINE 15 MG PO TABS
15.0000 mg | ORAL_TABLET | Freq: Every day | ORAL | Status: DC
  Administered 2024-02-14 – 2024-02-21 (×8): 15 mg via ORAL
  Filled 2024-02-14 (×8): qty 1

## 2024-02-14 MED ORDER — CYCLOBENZAPRINE HCL 5 MG PO TABS
5.0000 mg | ORAL_TABLET | Freq: Three times a day (TID) | ORAL | Status: DC | PRN
  Administered 2024-02-15 – 2024-02-22 (×15): 5 mg via ORAL
  Filled 2024-02-14 (×16): qty 1

## 2024-02-14 MED ORDER — 0.9 % SODIUM CHLORIDE (POUR BTL) OPTIME
TOPICAL | Status: DC | PRN
  Administered 2024-02-14: 1000 mL

## 2024-02-14 MED ORDER — ORAL CARE MOUTH RINSE: 15.0000 mL | Freq: Once | OROMUCOSAL | Status: AC

## 2024-02-14 MED ORDER — PRAVASTATIN SODIUM 10 MG PO TABS
40.0000 mg | ORAL_TABLET | Freq: Every day | ORAL | Status: DC
  Administered 2024-02-15 – 2024-02-22 (×7): 40 mg via ORAL
  Filled 2024-02-14 (×2): qty 4
  Filled 2024-02-14: qty 1
  Filled 2024-02-14 (×5): qty 4

## 2024-02-14 MED ORDER — PROPOFOL 10 MG/ML IV BOLUS
INTRAVENOUS | Status: AC
  Filled 2024-02-14: qty 20

## 2024-02-14 MED ORDER — PHENYLEPHRINE HCL-NACL 20-0.9 MG/250ML-% IV SOLN
INTRAVENOUS | Status: DC | PRN
  Administered 2024-02-14: 25 ug/min via INTRAVENOUS

## 2024-02-14 MED ORDER — ALUM & MAG HYDROXIDE-SIMETH 200-200-20 MG/5ML PO SUSP: 30.0000 mL | Freq: Four times a day (QID) | ORAL | Status: DC | PRN

## 2024-02-14 MED ORDER — ONDANSETRON HCL 4 MG PO TABS: 4.0000 mg | ORAL_TABLET | Freq: Four times a day (QID) | ORAL | Status: DC | PRN

## 2024-02-14 MED ORDER — ONDANSETRON HCL 4 MG/2ML IJ SOLN
INTRAMUSCULAR | Status: DC | PRN
  Administered 2024-02-14: 4 mg via INTRAVENOUS

## 2024-02-14 MED ORDER — ACETAMINOPHEN 10 MG/ML IV SOLN
INTRAVENOUS | Status: AC
  Filled 2024-02-14: qty 100

## 2024-02-14 MED ORDER — FENTANYL CITRATE (PF) 100 MCG/2ML IJ SOLN
INTRAMUSCULAR | Status: AC
Start: 2024-02-14 — End: 2024-02-15
  Filled 2024-02-14: qty 2

## 2024-02-14 MED ORDER — OXYCODONE-ACETAMINOPHEN 5-325 MG PO TABS
1.0000 | ORAL_TABLET | ORAL | Status: DC | PRN
  Administered 2024-02-15 – 2024-02-20 (×19): 1 via ORAL
  Filled 2024-02-14 (×20): qty 1

## 2024-02-14 MED ORDER — FENTANYL CITRATE (PF) 100 MCG/2ML IJ SOLN
25.0000 ug | INTRAMUSCULAR | Status: DC | PRN
  Administered 2024-02-14 (×3): 50 ug via INTRAVENOUS

## 2024-02-14 MED ORDER — ALBUMIN HUMAN 5 % IV SOLN: INTRAVENOUS | Status: DC | PRN

## 2024-02-14 MED ORDER — PHENOL 1.4 % MT LIQD: 1.0000 | OROMUCOSAL | Status: DC | PRN

## 2024-02-14 MED ORDER — BUPIVACAINE-EPINEPHRINE 0.5% -1:200000 IJ SOLN
INTRAMUSCULAR | Status: DC | PRN
  Administered 2024-02-14: 10 mL

## 2024-02-14 MED ORDER — OXYCODONE-ACETAMINOPHEN 10-325 MG PO TABS: 1.0000 | ORAL_TABLET | ORAL | Status: DC | PRN

## 2024-02-14 MED ORDER — ROCURONIUM BROMIDE 10 MG/ML (PF) SYRINGE
PREFILLED_SYRINGE | INTRAVENOUS | Status: AC
  Filled 2024-02-14: qty 10

## 2024-02-14 MED ORDER — DEXAMETHASONE 4 MG PO TABS
4.0000 mg | ORAL_TABLET | Freq: Four times a day (QID) | ORAL | Status: AC
  Administered 2024-02-14 – 2024-02-15 (×2): 4 mg via ORAL
  Filled 2024-02-14 (×2): qty 1

## 2024-02-14 MED ORDER — ACETAMINOPHEN 325 MG PO TABS
650.0000 mg | ORAL_TABLET | ORAL | Status: DC | PRN
  Administered 2024-02-15 – 2024-02-22 (×6): 650 mg via ORAL
  Filled 2024-02-14 (×5): qty 2

## 2024-02-14 SURGICAL SUPPLY — 55 items
BAG COUNTER SPONGE SURGICOUNT (BAG) ×1 IMPLANT
BAND RUBBER #18 3X1/16 STRL (MISCELLANEOUS) ×2 IMPLANT
BENZOIN TINCTURE PRP APPL 2/3 (GAUZE/BANDAGES/DRESSINGS) ×2 IMPLANT
BIT DRILL NEURO 2X3.1 SFT TUCH (MISCELLANEOUS) ×1 IMPLANT
BLADE SURG 15 STRL LF DISP TIS (BLADE) ×1 IMPLANT
BLADE ULTRA TIP 2M (BLADE) ×1 IMPLANT
BUR BARREL STRAIGHT FLUTE 4.0 (BURR) ×1 IMPLANT
BUR MATCHSTICK NEURO 3.0 LAGG (BURR) ×1 IMPLANT
CANISTER SUCT 3000ML PPV (MISCELLANEOUS) ×1 IMPLANT
CAP END 15X12 XCORE MINI 12D (Cap) IMPLANT
CORE XCORE MINI 012MM 15-20MM (Cage) ×1 IMPLANT
COVER MAYO STAND STRL (DRAPES) ×1 IMPLANT
DRAIN 10X20 3/4 PERF LF SIL ST (DRAIN) IMPLANT
DRAPE LAPAROTOMY 100X72 PEDS (DRAPES) ×1 IMPLANT
DRAPE MICROSCOPE SLANT 54X150 (MISCELLANEOUS) IMPLANT
DRAPE SURG 17X23 STRL (DRAPES) ×2 IMPLANT
DRSG OPSITE POSTOP 3X4 (GAUZE/BANDAGES/DRESSINGS) ×1 IMPLANT
DRSG OPSITE POSTOP 4X6 (GAUZE/BANDAGES/DRESSINGS) IMPLANT
ELECTRODE REM PT RTRN 9FT ADLT (ELECTROSURGICAL) ×1 IMPLANT
EVACUATOR SILICONE 100CC (DRAIN) IMPLANT
GAUZE 4X4 16PLY ~~LOC~~+RFID DBL (SPONGE) IMPLANT
GLOVE BIO SURGEON STRL SZ8 (GLOVE) ×1 IMPLANT
GLOVE BIO SURGEON STRL SZ8.5 (GLOVE) ×1 IMPLANT
GLOVE EXAM NITRILE XL STR (GLOVE) IMPLANT
GOWN STRL REUS W/ TWL LRG LVL3 (GOWN DISPOSABLE) IMPLANT
GOWN STRL REUS W/ TWL XL LVL3 (GOWN DISPOSABLE) ×1 IMPLANT
HEMOSTAT POWDER KIT SURGIFOAM (HEMOSTASIS) ×1 IMPLANT
KIT BASIN OR (CUSTOM PROCEDURE TRAY) ×1 IMPLANT
KIT TURNOVER KIT B (KITS) ×1 IMPLANT
MARKER SKIN DUAL TIP RULER LAB (MISCELLANEOUS) ×1 IMPLANT
NDL HYPO 22X1.5 SAFETY MO (MISCELLANEOUS) ×1 IMPLANT
NDL SPNL 18GX3.5 QUINCKE PK (NEEDLE) ×1 IMPLANT
NEEDLE HYPO 22X1.5 SAFETY MO (MISCELLANEOUS) ×1 IMPLANT
NEEDLE SPNL 18GX3.5 QUINCKE PK (NEEDLE) ×1 IMPLANT
NS IRRIG 1000ML POUR BTL (IV SOLUTION) ×1 IMPLANT
PACK LAMINECTOMY NEURO (CUSTOM PROCEDURE TRAY) ×1 IMPLANT
PATTIES SURGICAL .5 X.5 (GAUZE/BANDAGES/DRESSINGS) ×1 IMPLANT
PATTIES SURGICAL 1X1 (DISPOSABLE) ×1 IMPLANT
PIN DISTRACTION 14MM (PIN) ×2 IMPLANT
PLATE ANT CERV XTEND 2 LV 30 (Plate) IMPLANT
PUTTY DBM 10CC CALC GRAN (Putty) IMPLANT
SCREW SET END CAP (Screw) IMPLANT
SCREW VAR 4.2 XD SELF DRILL 16 (Screw) IMPLANT
SCREW XCORE MINI LOCK 12MM (Screw) IMPLANT
SPACER XCORE MINI 12 15-20 (Cage) IMPLANT
SPIKE FLUID TRANSFER (MISCELLANEOUS) ×1 IMPLANT
SPONGE INTESTINAL PEANUT (DISPOSABLE) ×2 IMPLANT
SPONGE SURGIFOAM ABS GEL 100 (HEMOSTASIS) IMPLANT
STRIP CLOSURE SKIN 1/2X4 (GAUZE/BANDAGES/DRESSINGS) ×1 IMPLANT
SUT VIC AB 0 CT1 27XBRD ANTBC (SUTURE) ×1 IMPLANT
SUT VIC AB 3-0 SH 8-18 (SUTURE) ×1 IMPLANT
SYR 30ML SLIP (SYRINGE) ×1 IMPLANT
TOWEL GREEN STERILE (TOWEL DISPOSABLE) ×1 IMPLANT
TOWEL GREEN STERILE FF (TOWEL DISPOSABLE) ×1 IMPLANT
WATER STERILE IRR 1000ML POUR (IV SOLUTION) ×1 IMPLANT

## 2024-02-14 NOTE — Anesthesia Preprocedure Evaluation (Addendum)
 Anesthesia Evaluation  Patient identified by MRN, date of birth, ID band Patient awake    Reviewed: Allergy & Precautions, NPO status , Patient's Chart, lab work & pertinent test results  Airway Mallampati: III  TM Distance: >3 FB Neck ROM: Limited    Dental no notable dental hx.    Pulmonary neg pulmonary ROS   Pulmonary exam normal        Cardiovascular hypertension, Pt. on medications Normal cardiovascular exam  Myocardial Perfusion Left ventricular function is normal. Nuclear stress EF: 73 %. The left ventricular ejection fraction is hyperdynamic (>65%). End diastolic cavity size is normal. Patient exercised for 4 min and 30 sec. Maximum HR of 141 bpm. MPHR 92.0 %. Peak METS 6.4 Normal blood pressure and normal heart rate response noted during stress. The study is normal. The study is low risk. No ST deviation was noted.    Neuro/Psych  Headaches  negative psych ROS   GI/Hepatic negative GI ROS,,,(+)     substance abuse    Endo/Other  negative endocrine ROS    Renal/GU Renal disease     Musculoskeletal  (+) Arthritis ,  narcotic dependent  Abdominal   Peds  Hematology negative hematology ROS (+)   Anesthesia Other Findings CLOSED DISPLACED FRACTURE OF C6 VERTEBRA WITH DELAYED HEALING  Reproductive/Obstetrics                             Anesthesia Physical Anesthesia Plan  ASA: 2  Anesthesia Plan: General   Post-op Pain Management:    Induction: Intravenous  PONV Risk Score and Plan: 2 and Ondansetron, Dexamethasone, Midazolam and Treatment may vary due to age or medical condition  Airway Management Planned: Oral ETT and Video Laryngoscope Planned  Additional Equipment:   Intra-op Plan:   Post-operative Plan: Extubation in OR  Informed Consent: I have reviewed the patients History and Physical, chart, labs and discussed the procedure including the risks, benefits and  alternatives for the proposed anesthesia with the patient or authorized representative who has indicated his/her understanding and acceptance.     Dental advisory given  Plan Discussed with: CRNA  Anesthesia Plan Comments:         Anesthesia Quick Evaluation

## 2024-02-14 NOTE — Progress Notes (Signed)
 Subjective: The patient is somnolent but easily arousable.  His neck is appropriately sore.  Objective: Vital signs in last 24 hours: Temp:  [97.9 F (36.6 C)-98.1 F (36.7 C)] 97.9 F (36.6 C) (04/28 2002) Pulse Rate:  [83-94] 94 (04/28 2002) Resp:  [14-18] 14 (04/28 2002) BP: (112-140)/(79) 140/79 (04/28 2002) SpO2:  [96 %-98 %] 96 % (04/28 2002) Weight:  [72.6 kg] 72.6 kg (04/28 1428) Estimated body mass index is 22.96 kg/m as calculated from the following:   Height as of this encounter: 5\' 10"  (1.778 m).   Weight as of this encounter: 72.6 kg.   Intake/Output from previous day: No intake/output data recorded. Intake/Output this shift: Total I/O In: 1000 [I.V.:750; IV Piggyback:250] Out: 150 [Blood:150]  Physical exam the patient is mildly somnolent but easily arousable.  He is moving all 4 extremities well.  His dressing is clean and dry.  There is no hematoma or shift.  His collar has been applied.  Lab Results: Recent Labs    02/14/24 1408  WBC 9.4  HGB 14.2  HCT 42.3  PLT 195   BMET Recent Labs    02/14/24 1408  NA 131*  K 4.6  CL 98  CO2 21*  GLUCOSE 106*  BUN 8  CREATININE 0.63  CALCIUM 9.2    Studies/Results: DG C-Arm 1-60 Min-No Report Result Date: 02/14/2024 Fluoroscopy was utilized by the requesting physician.  No radiographic interpretation.    Assessment/Plan: This post C5-C6 corpectomy instrumentation fusion from C4-C7: The patient is doing well neurologically.  I spoke with his family.  He is going to need a posterior cervical instrumentation and fusion later this week.  LOS: 0 days     [name] 02/14/2024, 8:20 PM

## 2024-02-14 NOTE — Plan of Care (Signed)

## 2024-02-14 NOTE — Transfer of Care (Signed)
 Immediate Anesthesia Transfer of Care Note  Patient: Brian Murray.  Procedure(s) Performed: ANTERIOR CERVICAL CORPECTOMY CERVICAL FIVE , CERVICAL SIX, CERVICAL FOUR-CERVICAL SEVEN ANTERIOR INSTRUMENTATION (Neck)  Patient Location: PACU  Anesthesia Type:General  Level of Consciousness: awake, alert , and oriented  Airway & Oxygen Therapy: Patient Spontanous Breathing  Post-op Assessment: Report given to RN and Post -op Vital signs reviewed and stable  Post vital signs: Reviewed and stable  Last Vitals: see PACU postop VS flowsheet Vitals Value Taken Time  BP    Temp    Pulse    Resp    SpO2      Last Pain:  Vitals:   02/14/24 1451  PainSc: 4       Patients Stated Pain Goal: 2 (02/14/24 1451)  Complications: No notable events documented.

## 2024-02-14 NOTE — Anesthesia Postprocedure Evaluation (Signed)
 Anesthesia Post Note  Patient: Brian Murray.  Procedure(s) Performed: ANTERIOR CERVICAL CORPECTOMY CERVICAL FIVE , CERVICAL SIX, CERVICAL FOUR-CERVICAL SEVEN ANTERIOR INSTRUMENTATION (Neck)     Patient location during evaluation: PACU Anesthesia Type: General Level of consciousness: awake and alert, oriented and patient cooperative Pain management: pain level controlled Vital Signs Assessment: post-procedure vital signs reviewed and stable Respiratory status: spontaneous breathing, nonlabored ventilation, respiratory function stable and patient connected to nasal cannula oxygen Cardiovascular status: blood pressure returned to baseline and stable Postop Assessment: no apparent nausea or vomiting Anesthetic complications: no   No notable events documented.  Last Vitals:  Vitals:   02/14/24 2045 02/14/24 2100  BP: 119/71 129/69  Pulse: 90 89  Resp: 18 12  Temp:  36.9 C  SpO2: 97% 98%    Last Pain:  Vitals:   02/14/24 2053  PainSc: 5                  Lamya Lausch,E. Fraya Ueda

## 2024-02-14 NOTE — Anesthesia Procedure Notes (Signed)
 Procedure Name: Intubation Date/Time: 02/14/2024 4:39 PM  Performed by: Gabe Jock, CRNAPre-anesthesia Checklist: Patient identified, Emergency Drugs available, Suction available and Patient being monitored Patient Re-evaluated:Patient Re-evaluated prior to induction Oxygen Delivery Method: Circle System Utilized Preoxygenation: Pre-oxygenation with 100% oxygen Induction Type: IV induction Ventilation: Mask ventilation without difficulty Laryngoscope Size: Glidescope and 3 Grade View: Grade I Tube type: Oral Tube size: 7.5 mm Number of attempts: 1 Airway Equipment and Method: Stylet Placement Confirmation: ETT inserted through vocal cords under direct vision, positive ETCO2 and breath sounds checked- equal and bilateral Secured at: 23 cm Tube secured with: Tape Dental Injury: Teeth and Oropharynx as per pre-operative assessment
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
## 2024-02-15 ENCOUNTER — Other Ambulatory Visit: Payer: Self-pay | Admitting: Neurosurgery

## 2024-02-15 ENCOUNTER — Encounter (HOSPITAL_COMMUNITY): Payer: Self-pay | Admitting: Neurosurgery

## 2024-02-15 MED FILL — Thrombin For Soln 5000 Unit: CUTANEOUS | Qty: 5000 | Status: AC

## 2024-02-15 NOTE — Progress Notes (Signed)
 Subjective: The patient is alert and pleasant.  He is in no apparent distress.  He is working with OT.  His wife is at the bedside.  He tells me that his right arm feels "the best it has in a long time".  Objective: Vital signs in last 24 hours: Temp:  [97.6 F (36.4 C)-98.5 F (36.9 C)] 97.6 F (36.4 C) (04/29 0818) Pulse Rate:  [77-94] 86 (04/29 0818) Resp:  [11-18] 17 (04/29 0818) BP: (99-140)/(67-90) 114/90 (04/29 0818) SpO2:  [96 %-100 %] 100 % (04/29 0818) Weight:  [68.2 kg-72.6 kg] 68.2 kg (04/28 2132) Estimated body mass index is 21.58 kg/m as calculated from the following:   Height as of this encounter: 5\' 10"  (1.778 m).   Weight as of this encounter: 68.2 kg.   Intake/Output from previous day: 04/28 0701 - 04/29 0700 In: 2200 [I.V.:1750; IV Piggyback:450] Out: 425 [Drains:50; Blood:375] Intake/Output this shift: Total I/O In: 340 [P.O.:240; IV Piggyback:100] Out: -   Physical exam the patient is alert and pleasant.  He is moving all 4 extremities well.  Extraocular muscles are intact.  Lab Results: Recent Labs    02/14/24 1408  WBC 9.4  HGB 14.2  HCT 42.3  PLT 195   BMET Recent Labs    02/14/24 1408  NA 131*  K 4.6  CL 98  CO2 21*  GLUCOSE 106*  BUN 8  CREATININE 0.63  CALCIUM 9.2    Studies/Results: DG Cervical Spine 1 View Result Date: 02/14/2024 CLINICAL DATA:  Anterior cervical corpectomy and cervical anterior instrumentation EXAM: DG CERVICAL SPINE - 1 VIEW COMPARISON:  CT cervical spine 02/11/2024 FLUOROSCOPY TIME:  Fluoroscopy Time:  6.5 seconds Radiation Exposure Index (if provided by the fluoroscopic device): 1.44 mGy Number of Acquired Spot Images: 1 FINDINGS: Single intraoperative fluoroscopic spot images are provided without a radiologist present demonstrating a ACDF of C4-C7 with C5-C6 corpectomy. IMPRESSION: Intraoperative fluoroscopic spot images during ACDF of C4-C7 with C5-C6 corpectomy. Electronically Signed   By: Rozell Cornet  M.D.   On: 02/14/2024 22:08   DG Cervical Spine 2 or 3 views Result Date: 02/14/2024 CLINICAL DATA:  Intraoperative images used for localization. EXAM: CERVICAL SPINE - 2-3 VIEW COMPARISON:  None Available. FINDINGS: Three intraoperative images of the lateral cervical spine used for localization. IMPRESSION: Intraoperative images used for localization. Electronically Signed   By: Rozell Cornet M.D.   On: 02/14/2024 22:06   DG C-Arm 1-60 Min-No Report Result Date: 02/14/2024 Fluoroscopy was utilized by the requesting physician.  No radiographic interpretation.    Assessment/Plan: Postop day #1: The patient is doing well.  I informed him that we will plan to move his surgery up until tomorrow because of a cancellation.  He is Adult nurse.  I have answered all their questions.  LOS: 1 day     [name] 02/15/2024, 12:01 PM     Patient ID: Brian Murray., male   DOB: June 04, 1951, 73 y.o.   MRN: [id number]

## 2024-02-15 NOTE — Progress Notes (Signed)
 Patient complaining of double vision that is new post-procedure. Will call Neurosurgery and make them aware.  02/15/24 0745  Charting Type  Focused Reassessment Changes Noted (S)  HEENT  HEENT  HEENT (WDL) X  Vision Check No  R Eye (S)  Double vision  L Eye (S)  Double vision

## 2024-02-15 NOTE — Plan of Care (Signed)

## 2024-02-15 NOTE — TOC CM/SW Note (Signed)
 Transition of Care [facility]) - Inpatient Brief Assessment   Patient Details  Name: Brian Murray. MRN: [id number] Date of Birth: 10/25/1950  [facility] CM/SW Contact:    Jonathan Neighbor, RN Phone Number: 02/15/2024, 2:39 PM   Clinical Narrative:  S/p cervical surgery. No follow up per OT. Currently no TOC needs. Please consult TOC as needs arise.  Transition of Care Asessment: Insurance and Status: Insurance coverage has been reviewed Patient has primary care physician: Yes Home environment has been reviewed: home with spouse   Prior/Current Home Services: No current home services Social Drivers of Health Review: SDOH reviewed no interventions necessary Readmission risk has been reviewed: Yes Transition of care needs: no transition of care needs at this time

## 2024-02-15 NOTE — Patient Instructions (Signed)
 Brian Murray

## 2024-02-15 NOTE — Progress Notes (Signed)
 Patient accidentally ripped JP drain out walking to bathroom. Site dry without drainage. Cervical honeycomb dressing intact. Will call neurosurgery and make them aware.

## 2024-02-15 NOTE — Progress Notes (Signed)
 Subjective: The patient is alert and pleasant.  His wife is at the bedside.  He looks well.  Objective: Vital signs in last 24 hours: Temp:  [97.6 F (36.4 C)-98.5 F (36.9 C)] 98 F (36.7 C) (04/29 0547) Pulse Rate:  [77-94] 77 (04/29 0547) Resp:  [11-18] 18 (04/29 0547) BP: (99-140)/(67-79) 99/67 (04/29 0547) SpO2:  [96 %-99 %] 98 % (04/29 0547) Weight:  [68.2 kg-72.6 kg] 68.2 kg (04/28 2132) Estimated body mass index is 21.58 kg/m as calculated from the following:   Height as of this encounter: 5\' 10"  (1.778 m).   Weight as of this encounter: 68.2 kg.   Intake/Output from previous day: 04/28 0701 - 04/29 0700 In: 2200 [I.V.:1750; IV Piggyback:450] Out: 425 [Drains:50; Blood:375] Intake/Output this shift: No intake/output data recorded.  Physical exam the patient is alert and pleasant.  He is moving all 4 extremities well.  His dressing is clean and dry.  There is no hematoma or shift.  His JP drain has put out 50 cc overnight.  Lab Results: Recent Labs    02/14/24 1408  WBC 9.4  HGB 14.2  HCT 42.3  PLT 195   BMET Recent Labs    02/14/24 1408  NA 131*  K 4.6  CL 98  CO2 21*  GLUCOSE 106*  BUN 8  CREATININE 0.63  CALCIUM 9.2    Studies/Results: DG Cervical Spine 1 View Result Date: 02/14/2024 CLINICAL DATA:  Anterior cervical corpectomy and cervical anterior instrumentation EXAM: DG CERVICAL SPINE - 1 VIEW COMPARISON:  CT cervical spine 02/11/2024 FLUOROSCOPY TIME:  Fluoroscopy Time:  6.5 seconds Radiation Exposure Index (if provided by the fluoroscopic device): 1.44 mGy Number of Acquired Spot Images: 1 FINDINGS: Single intraoperative fluoroscopic spot images are provided without a radiologist present demonstrating a ACDF of C4-C7 with C5-C6 corpectomy. IMPRESSION: Intraoperative fluoroscopic spot images during ACDF of C4-C7 with C5-C6 corpectomy. Electronically Signed   By: Rozell Cornet M.D.   On: 02/14/2024 22:08   DG Cervical Spine 2 or 3  views Result Date: 02/14/2024 CLINICAL DATA:  Intraoperative images used for localization. EXAM: CERVICAL SPINE - 2-3 VIEW COMPARISON:  None Available. FINDINGS: Three intraoperative images of the lateral cervical spine used for localization. IMPRESSION: Intraoperative images used for localization. Electronically Signed   By: Rozell Cornet M.D.   On: 02/14/2024 22:06   DG C-Arm 1-60 Min-No Report Result Date: 02/14/2024 Fluoroscopy was utilized by the requesting physician.  No radiographic interpretation.    Assessment/Plan: C6 fracture/subluxation: I have discussed the situation with the patient and his wife.  I told the patient his bone was relatively soft and I am not confident he would heal properly whether without a subsequent posterior cervical surgery.  I recommended a posterior cervical instrumentation and fusion from approximately C2-T1.  I described that surgery to them.  We discussed the risks including risk of anesthesia, hemorrhage, infection, nerve injury, vertebral artery injury, instrumentation outpatient malfunction, fusion failure, medical risk, etc.  I have answered all their questions.  He wants to proceed with surgery.  We will plan to do it on Thursday.  We will likely remove his JP drain later today or tomorrow.  LOS: 1 day     Brian Murray 02/15/2024, 7:37 AM     Patient ID: Brian Murray., male   DOB: 08/14/1951, 73 y.o.   MRN: [id number]

## 2024-02-15 NOTE — Progress Notes (Signed)
 [facility] called and message left with receptionist. Made them aware of JP drain removal and double-vision. Per receptionist, will page Neurosurgery MD.

## 2024-02-15 NOTE — Evaluation (Signed)
 Occupational Therapy Evaluation Patient Details Name: Brian Murray. MRN: [id number] DOB: 12-16-1950 Today's Date: 02/15/2024   History of Present Illness   Pt is a 73 y/o male s/p C5-C6 corpectomy instrumentation fusion from C4-C7 with plan for posterior cervical fusion C5 on 4/30. PMH: Chronic low back pain, HTN, rocky [place] spotted fever, previous posterior interspinous wiring and methylmethacrylate fusion d/t to neck fracture in 1977.     Clinical Impressions Patient is s/p anterior cervical corpectomy instrumentation fusion from C4-C7 surgery resulting in functional limitations due to the deficits listed below (see OT Problem List). All education has been completed during evaluation. Pt and spouse educated on collar wear, compensatory strategies for ADLs/mobility and cervical precautions. Pt verbalized and/or demonstrated understanding.  No follow up OT services recommended although will continue to keep pt on caseload to check in after planned surgery for 4/30 before discharging from acute OT services.        If plan is discharge home, recommend the following:   Assistance with cooking/housework;Assist for transportation     Functional Status Assessment   Patient has had a recent decline in their functional status and demonstrates the ability to make significant improvements in function in a reasonable and predictable amount of time.     Equipment Recommendations   None recommended by OT      Precautions/Restrictions   Precautions Precautions: Cervical Precaution Booklet Issued: Yes (comment) Recall of Precautions/Restrictions: Intact Precaution/Restrictions Comments: pt and wife educated on cervical precautions, compensatory techniques for ADL completion, sitting and standing posture awareness, ROM restrictions. Restrictions Weight Bearing Restrictions Per Provider Order: No     Mobility Bed Mobility Overal bed mobility: Independent    Transfers Overall transfer level: Independent Equipment used: None       Balance Overall balance assessment: No apparent balance deficits (not formally assessed)       ADL either performed or assessed with clinical judgement   ADL    General ADL Comments: Pt is able to complete all BADL tasks with SBA/set-up while seated or standing.     Vision Baseline Vision/History: 1 Wears glasses Ability to See in Adequate Light: 0 Adequate Patient Visual Report: Diplopia Additional Comments: Neurosurgery is aware of double vision and believe that it is due to medication. Will continue to monitor.     Perception Perception: Not tested       Praxis Praxis: Not tested       Pertinent Vitals/Pain Pain Assessment Pain Assessment: Faces Faces Pain Scale: Hurts even more Pain Location: left shoulder and neck Pain Descriptors / Indicators: Sore, Aching, Constant Pain Intervention(s): Monitored during session     Extremity/Trunk Assessment Upper Extremity Assessment Upper Extremity Assessment: Right hand dominant;Overall [facility] for tasks assessed (within cervical precautions)   Lower Extremity Assessment Lower Extremity Assessment: Overall WFL for tasks assessed   Cervical / Trunk Assessment Cervical / Trunk Assessment: Neck Surgery   Communication Communication Communication: No apparent difficulties   Cognition Arousal: Alert Behavior During Therapy: WFL for tasks assessed/performed Cognition: No apparent impairments    Following commands: Intact       Cueing  General Comments      Aspen cervical collar adjusted for proper fit while seated on EOB. Honeycomb dressing intact on anterior region of neck.           Home Living Family/patient expects to be discharged to:: Private residence Living Arrangements: Spouse/significant other Available Help at Discharge: Family;Available 24 hours/day      Home Equipment: None  Prior Functioning/Environment Prior  Level of Function : Independent/Modified Independent;Driving;History of Falls (last six months)       OT Problem List: Pain;Decreased knowledge of precautions   OT Treatment/Interventions: Self-care/ADL training;Patient/family education;Therapeutic exercise;Therapeutic activities      OT Goals(Current goals can be found in the care plan section)   Acute Rehab OT Goals Patient Stated Goal: to have surgery tomorrow OT Goal Formulation: With patient Time For Goal Achievement: 02/18/24 Potential to Achieve Goals: Good   OT Frequency:  Min 2X/week       AM-PAC OT "6 Clicks" Daily Activity     Outcome Measure Help from another person eating meals?: None Help from another person taking care of personal grooming?: None Help from another person toileting, which includes using toliet, bedpan, or urinal?: None Help from another person bathing (including washing, rinsing, drying)?: None Help from another person to put on and taking off regular upper body clothing?: None Help from another person to put on and taking off regular lower body clothing?: None 6 Click Score: 24   End of Session Equipment Utilized During Treatment: Cervical collar  Activity Tolerance: Patient tolerated treatment well Patient left: in bed;with call bell/phone within reach;with family/visitor present  OT Visit Diagnosis: Pain Pain - Right/Left: Left Pain - part of body: Arm (neck)                Time: 1152-1209 OT Time Calculation (min): 17 min Charges:  OT General Charges $OT Visit: 1 Visit OT Evaluation $OT Eval Moderate Complexity: 1 Mod  [employer], OTR/L,CBIS  Supplemental OT - [facility] and [facility] Secure Chat Preferred    Guiseppe Flanagan, Ocie Belt 02/15/2024, 12:27 PM

## 2024-02-16 ENCOUNTER — Inpatient Hospital Stay (HOSPITAL_COMMUNITY)

## 2024-02-16 ENCOUNTER — Encounter (HOSPITAL_COMMUNITY)
Admission: RE | Disposition: A | Discharge status: Home / Self Care | Payer: Self-pay | Source: Home / Self Care | Attending: Neurosurgery

## 2024-02-16 ENCOUNTER — Encounter (HOSPITAL_COMMUNITY): Payer: Self-pay | Admitting: Neurosurgery

## 2024-02-16 ENCOUNTER — Inpatient Hospital Stay (HOSPITAL_COMMUNITY): Payer: Self-pay

## 2024-02-16 ENCOUNTER — Other Ambulatory Visit: Payer: Self-pay

## 2024-02-16 DIAGNOSIS — S12590G Other displaced fracture of sixth cervical vertebra, subsequent encounter for fracture with delayed healing: Secondary | ICD-10-CM

## 2024-02-16 DIAGNOSIS — I10 Essential (primary) hypertension: Secondary | ICD-10-CM | POA: Diagnosis not present

## 2024-02-16 DIAGNOSIS — I2510 Atherosclerotic heart disease of native coronary artery without angina pectoris: Secondary | ICD-10-CM

## 2024-02-16 HISTORY — PX: POSTERIOR CERVICAL FUSION/FORAMINOTOMY: SHX5038

## 2024-02-16 LAB — POCT I-STAT, CHEM 8
BUN: 17 mg/dL (ref 8–23)
Calcium, Ion: 1.14 mmol/L — ABNORMAL LOW (ref 1.15–1.40)
Chloride: 101 mmol/L (ref 98–111)
Creatinine, Ser: 0.7 mg/dL (ref 0.61–1.24)
Glucose, Bld: 108 mg/dL — ABNORMAL HIGH (ref 70–99)
HCT: 37 % — ABNORMAL LOW (ref 39.0–52.0)
Hemoglobin: 12.6 g/dL — ABNORMAL LOW (ref 13.0–17.0)
Potassium: 4.8 mmol/L (ref 3.5–5.1)
Sodium: 134 mmol/L — ABNORMAL LOW (ref 135–145)
TCO2: 27 mmol/L (ref 22–32)

## 2024-02-16 SURGERY — POSTERIOR CERVICAL FUSION/FORAMINOTOMY LEVEL 5
Anesthesia: General

## 2024-02-16 MED ORDER — LIDOCAINE 2% (20 MG/ML) 5 ML SYRINGE
INTRAMUSCULAR | Status: DC | PRN
  Administered 2024-02-16: 100 mg via INTRAVENOUS

## 2024-02-16 MED ORDER — ROCURONIUM BROMIDE 10 MG/ML (PF) SYRINGE
PREFILLED_SYRINGE | INTRAVENOUS | Status: DC | PRN
  Administered 2024-02-16: 100 mg via INTRAVENOUS
  Administered 2024-02-16: 20 mg via INTRAVENOUS

## 2024-02-16 MED ORDER — LACTATED RINGERS IV SOLN: INTRAVENOUS | Status: DC

## 2024-02-16 MED ORDER — FENTANYL CITRATE (PF) 100 MCG/2ML IJ SOLN
25.0000 ug | INTRAMUSCULAR | Status: DC | PRN
  Administered 2024-02-16 (×3): 50 ug via INTRAVENOUS

## 2024-02-16 MED ORDER — HYDROMORPHONE HCL 1 MG/ML IJ SOLN
INTRAMUSCULAR | Status: AC
  Filled 2024-02-16: qty 1

## 2024-02-16 MED ORDER — FENTANYL CITRATE (PF) 250 MCG/5ML IJ SOLN
INTRAMUSCULAR | Status: DC | PRN
Start: 2024-02-16 — End: 2024-02-16
  Administered 2024-02-16: 100 ug via INTRAVENOUS
  Administered 2024-02-16 (×2): 50 ug via INTRAVENOUS

## 2024-02-16 MED ORDER — THROMBIN 5000 UNITS EX SOLR
OROMUCOSAL | Status: DC | PRN
  Administered 2024-02-16: 5 mL via TOPICAL

## 2024-02-16 MED ORDER — PROPOFOL 10 MG/ML IV BOLUS
INTRAVENOUS | Status: AC
  Filled 2024-02-16: qty 20

## 2024-02-16 MED ORDER — BUPIVACAINE LIPOSOME 1.3 % IJ SUSP
INTRAMUSCULAR | Status: AC
  Filled 2024-02-16: qty 20

## 2024-02-16 MED ORDER — HYDROMORPHONE HCL 1 MG/ML IJ SOLN
0.2500 mg | INTRAMUSCULAR | Status: DC | PRN
  Administered 2024-02-16: 0.5 mg via INTRAVENOUS

## 2024-02-16 MED ORDER — BUPIVACAINE-EPINEPHRINE (PF) 0.5% -1:200000 IJ SOLN
INTRAMUSCULAR | Status: DC | PRN
  Administered 2024-02-16: 10 mL

## 2024-02-16 MED ORDER — HYDROMORPHONE HCL 1 MG/ML IJ SOLN
INTRAMUSCULAR | Status: DC | PRN
  Administered 2024-02-16: .5 mg via INTRAVENOUS

## 2024-02-16 MED ORDER — FENTANYL CITRATE (PF) 100 MCG/2ML IJ SOLN
INTRAMUSCULAR | Status: AC
  Filled 2024-02-16: qty 2

## 2024-02-16 MED ORDER — DEXAMETHASONE SODIUM PHOSPHATE 10 MG/ML IJ SOLN
INTRAMUSCULAR | Status: AC
  Filled 2024-02-16: qty 1

## 2024-02-16 MED ORDER — PANTOPRAZOLE SODIUM 40 MG IV SOLR
40.0000 mg | Freq: Every day | INTRAVENOUS | Status: DC
Start: 2024-02-16 — End: 2024-02-17
  Administered 2024-02-16: 40 mg via INTRAVENOUS
  Filled 2024-02-16: qty 10

## 2024-02-16 MED ORDER — KETAMINE HCL 50 MG/5ML IJ SOSY
PREFILLED_SYRINGE | INTRAMUSCULAR | Status: AC
Start: 2024-02-16 — End: ?
  Filled 2024-02-16: qty 5

## 2024-02-16 MED ORDER — 0.9 % SODIUM CHLORIDE (POUR BTL) OPTIME
TOPICAL | Status: DC | PRN
  Administered 2024-02-16: 1000 mL

## 2024-02-16 MED ORDER — PHENYLEPHRINE HCL-NACL 20-0.9 MG/250ML-% IV SOLN
INTRAVENOUS | Status: DC | PRN
  Administered 2024-02-16: 25 ug/min via INTRAVENOUS

## 2024-02-16 MED ORDER — DEXAMETHASONE SODIUM PHOSPHATE 10 MG/ML IJ SOLN
INTRAMUSCULAR | Status: DC | PRN
  Administered 2024-02-16: 10 mg via INTRAVENOUS

## 2024-02-16 MED ORDER — ONDANSETRON HCL 4 MG/2ML IJ SOLN: 4.0000 mg | Freq: Once | INTRAMUSCULAR | Status: DC | PRN

## 2024-02-16 MED ORDER — FENTANYL CITRATE (PF) 250 MCG/5ML IJ SOLN
INTRAMUSCULAR | Status: AC
  Filled 2024-02-16: qty 5

## 2024-02-16 MED ORDER — CHLORHEXIDINE GLUCONATE 0.12 % MT SOLN
OROMUCOSAL | Status: AC
  Filled 2024-02-16: qty 15

## 2024-02-16 MED ORDER — BUPIVACAINE-EPINEPHRINE (PF) 0.5% -1:200000 IJ SOLN
INTRAMUSCULAR | Status: AC
  Filled 2024-02-16: qty 30

## 2024-02-16 MED ORDER — ACETAMINOPHEN 500 MG PO TABS
1000.0000 mg | ORAL_TABLET | Freq: Four times a day (QID) | ORAL | Status: AC
  Administered 2024-02-16 – 2024-02-17 (×3): 1000 mg via ORAL
  Filled 2024-02-16 (×3): qty 2

## 2024-02-16 MED ORDER — ONDANSETRON HCL 4 MG/2ML IJ SOLN
INTRAMUSCULAR | Status: AC
  Filled 2024-02-16: qty 2

## 2024-02-16 MED ORDER — HYDROMORPHONE HCL 1 MG/ML IJ SOLN
INTRAMUSCULAR | Status: AC
  Filled 2024-02-16: qty 0.5

## 2024-02-16 MED ORDER — PROPOFOL 10 MG/ML IV BOLUS
INTRAVENOUS | Status: DC | PRN
  Administered 2024-02-16: 150 mg via INTRAVENOUS
  Administered 2024-02-16: 50 mg via INTRAVENOUS

## 2024-02-16 MED ORDER — CEFAZOLIN SODIUM-DEXTROSE 2-4 GM/100ML-% IV SOLN
2.0000 g | Freq: Three times a day (TID) | INTRAVENOUS | Status: AC
  Administered 2024-02-16 – 2024-02-17 (×2): 2 g via INTRAVENOUS
  Filled 2024-02-16 (×2): qty 100

## 2024-02-16 MED ORDER — KETAMINE HCL 10 MG/ML IJ SOLN
INTRAMUSCULAR | Status: DC | PRN
Start: 2024-02-16 — End: 2024-02-16
  Administered 2024-02-16: 10 mg via INTRAVENOUS
  Administered 2024-02-16: 30 mg via INTRAVENOUS

## 2024-02-16 MED ORDER — ACETAMINOPHEN 10 MG/ML IV SOLN
INTRAVENOUS | Status: DC | PRN
  Administered 2024-02-16: 1000 mg via INTRAVENOUS

## 2024-02-16 MED ORDER — SUGAMMADEX SODIUM 200 MG/2ML IV SOLN
INTRAVENOUS | Status: DC | PRN
  Administered 2024-02-16: 200 mg via INTRAVENOUS

## 2024-02-16 MED ORDER — THROMBIN 5000 UNITS EX KIT
PACK | CUTANEOUS | Status: AC
  Filled 2024-02-16: qty 1

## 2024-02-16 MED ORDER — BACITRACIN ZINC 500 UNIT/GM EX OINT
TOPICAL_OINTMENT | CUTANEOUS | Status: AC
  Filled 2024-02-16: qty 28.35

## 2024-02-16 MED ORDER — ESMOLOL HCL 100 MG/10ML IV SOLN
INTRAVENOUS | Status: AC
  Filled 2024-02-16: qty 10

## 2024-02-16 MED ORDER — VASHE WOUND IRRIGATION OPTIME
TOPICAL | Status: DC | PRN
  Administered 2024-02-16: 34 [oz_av]

## 2024-02-16 MED ORDER — SODIUM CHLORIDE 0.9 % IV SOLN: INTRAVENOUS | Status: DC | PRN

## 2024-02-16 MED ORDER — BUPIVACAINE LIPOSOME 1.3 % IJ SUSP
INTRAMUSCULAR | Status: DC | PRN
  Administered 2024-02-16: 20 mL

## 2024-02-16 MED ORDER — BACITRACIN ZINC 500 UNIT/GM EX OINT
TOPICAL_OINTMENT | CUTANEOUS | Status: DC | PRN
  Administered 2024-02-16 (×2): 1 via TOPICAL

## 2024-02-16 MED ORDER — PHENYLEPHRINE 80 MCG/ML (10ML) SYRINGE FOR IV PUSH (FOR BLOOD PRESSURE SUPPORT)
PREFILLED_SYRINGE | INTRAVENOUS | Status: DC | PRN
  Administered 2024-02-16: 120 ug via INTRAVENOUS
  Administered 2024-02-16 (×2): 80 ug via INTRAVENOUS
  Administered 2024-02-16: 160 ug via INTRAVENOUS

## 2024-02-16 SURGICAL SUPPLY — 69 items
BAG COUNTER SPONGE SURGICOUNT (BAG) ×2 IMPLANT
BAND RUBBER #18 3X1/16 STRL (MISCELLANEOUS) ×4 IMPLANT
BENZOIN TINCTURE PRP APPL 2/3 (GAUZE/BANDAGES/DRESSINGS) ×2 IMPLANT
BIT DRILL NEURO 2X3.1 SFT TUCH (MISCELLANEOUS) ×2 IMPLANT
BLADE CLIPPER SURG (BLADE) ×2 IMPLANT
BLADE SURG 11 STRL SS (BLADE) IMPLANT
BLADE ULTRA TIP 2M (BLADE) IMPLANT
BUR 14 MATCH 3 (BUR) IMPLANT
BUR 15 MATCH 2.2 (BUR) IMPLANT
BUR MR8 14 BALL 5 (BUR) IMPLANT
CANISTER SUCT 3000ML PPV (MISCELLANEOUS) ×2 IMPLANT
CAP CLSR POST CERV (Cap) IMPLANT
CONNECTOR TRANS ROD SPINAL 40 (Rod) IMPLANT
COVERAGE SUPPORT O-ARM STEALTH (MISCELLANEOUS) ×2 IMPLANT
DRAPE C-ARM 42X72 X-RAY (DRAPES) ×4 IMPLANT
DRAPE LAPAROTOMY 100X72 PEDS (DRAPES) ×2 IMPLANT
DRAPE MICROSCOPE SLANT 54X150 (MISCELLANEOUS) IMPLANT
DRAPE SHEET LG 3/4 BI-LAMINATE (DRAPES) ×8 IMPLANT
DRAPE SURG 17X23 STRL (DRAPES) ×4 IMPLANT
DRSG OPSITE POSTOP 4X8 (GAUZE/BANDAGES/DRESSINGS) IMPLANT
ELECTRODE BLDE 4.0 EZ CLN MEGD (MISCELLANEOUS) ×2 IMPLANT
ELECTRODE REM PT RTRN 9FT ADLT (ELECTROSURGICAL) ×2 IMPLANT
EVACUATOR 1/8 PVC DRAIN (DRAIN) IMPLANT
FEE COVERAGE SUPPORT O-ARM (MISCELLANEOUS) ×2 IMPLANT
GAUZE 4X4 16PLY ~~LOC~~+RFID DBL (SPONGE) IMPLANT
GAUZE SPONGE 4X4 12PLY STRL (GAUZE/BANDAGES/DRESSINGS) IMPLANT
GLOVE BIO SURGEON STRL SZ8 (GLOVE) ×4 IMPLANT
GLOVE BIO SURGEON STRL SZ8.5 (GLOVE) ×4 IMPLANT
GLOVE EXAM NITRILE XL STR (GLOVE) IMPLANT
GOWN STRL REUS W/ TWL LRG LVL3 (GOWN DISPOSABLE) IMPLANT
GOWN STRL REUS W/ TWL XL LVL3 (GOWN DISPOSABLE) ×2 IMPLANT
GOWN STRL REUS W/TWL 2XL LVL3 (GOWN DISPOSABLE) IMPLANT
HEMOSTAT POWDER KIT SURGIFOAM (HEMOSTASIS) ×2 IMPLANT
KIT BASIN OR (CUSTOM PROCEDURE TRAY) ×2 IMPLANT
KIT TURNOVER KIT B (KITS) ×2 IMPLANT
MARKER SPHERE PSV REFLC NDI (MISCELLANEOUS) ×10 IMPLANT
NDL HYPO 22X1.5 SAFETY MO (MISCELLANEOUS) ×2 IMPLANT
NDL SPNL 18GX3.5 QUINCKE PK (NEEDLE) IMPLANT
NEEDLE HYPO 22X1.5 SAFETY MO (MISCELLANEOUS) ×2 IMPLANT
NEEDLE SPNL 18GX3.5 QUINCKE PK (NEEDLE) IMPLANT
NS IRRIG 1000ML POUR BTL (IV SOLUTION) ×2 IMPLANT
PACK LAMINECTOMY NEURO (CUSTOM PROCEDURE TRAY) ×2 IMPLANT
PAD ARMBOARD POSITIONER FOAM (MISCELLANEOUS) ×6 IMPLANT
PATTIES SURGICAL .25X.25 (GAUZE/BANDAGES/DRESSINGS) IMPLANT
PATTIES SURGICAL .5 X.5 (GAUZE/BANDAGES/DRESSINGS) ×4 IMPLANT
PATTIES SURGICAL 1X1 (DISPOSABLE) ×4 IMPLANT
PIN MAYFIELD SKULL DISP (PIN) ×2 IMPLANT
PUTTY DBM 10CC CALC GRAN (Putty) IMPLANT
ROD SPINAL 3.5X400MM TI (Rod) IMPLANT
ROD VIRAGE BLUE CRVD 3.5X90MM (Rod) IMPLANT
SCREW PA VIRAGE 4.5X30 (Screw) IMPLANT
SCREW SPINAL 3.5X22MM POLY (Screw) IMPLANT
SCREW VIRAGE 3.5X20 (Screw) IMPLANT
SCREW VIRAGE POST CERV 3.5X16 (Screw) IMPLANT
SPIKE FLUID TRANSFER (MISCELLANEOUS) ×2 IMPLANT
SPONGE NEURO XRAY DETECT 1X3 (DISPOSABLE) IMPLANT
SPONGE SURGIFOAM ABS GEL 100 (HEMOSTASIS) IMPLANT
SPONGE T-LAP 4X18 ~~LOC~~+RFID (SPONGE) IMPLANT
STAPLER SKIN PROX WIDE 3.9 (STAPLE) IMPLANT
STAPLER VISISTAT (STAPLE) IMPLANT
STRIP CLOSURE SKIN 1/2X4 (GAUZE/BANDAGES/DRESSINGS) ×2 IMPLANT
SUT ETHILON 2 0 FS 18 (SUTURE) IMPLANT
SUT VIC AB 0 CT1 18XCR BRD8 (SUTURE) ×2 IMPLANT
SUT VIC AB 2-0 CP2 18 (SUTURE) ×2 IMPLANT
SYR 30ML SLIP (SYRINGE) ×4 IMPLANT
TOWEL GREEN STERILE (TOWEL DISPOSABLE) ×2 IMPLANT
TOWEL GREEN STERILE FF (TOWEL DISPOSABLE) ×2 IMPLANT
TRAY FOLEY MTR SLVR 16FR STAT (SET/KITS/TRAYS/PACK) IMPLANT
WATER STERILE IRR 1000ML POUR (IV SOLUTION) ×2 IMPLANT

## 2024-02-16 NOTE — Progress Notes (Addendum)
 Patient A/Ox4 and stable at time of transport to OR. Patient given CHG bath and pre-op checklist completed.

## 2024-02-16 NOTE — Transfer of Care (Signed)
 Immediate Anesthesia Transfer of Care Note  Patient: Brian Murray.  Procedure(s) Performed: Posterior Cervical Instrumentation fusion Cervical Two-Thoracic One APPLICATION OF O-ARM  Patient Location: PACU  Anesthesia Type:General  Level of Consciousness: awake and sedated  Airway & Oxygen Therapy: Patient connected to face mask oxygen  Post-op Assessment: Report given to RN and Post -op Vital signs reviewed and stable  Post vital signs: Reviewed and stable  Last Vitals:  Vitals Value Taken Time  BP 133/92 02/16/24 1820  Temp    Pulse 97 02/16/24 1823  Resp 16 02/16/24 1823  SpO2 100 % 02/16/24 1823  Vitals shown include unfiled device data.  Last Pain:  Vitals:   02/16/24 1146  TempSrc: Oral  PainSc:       Patients Stated Pain Goal: 0 (02/16/24 1124)  Complications: No notable events documented.

## 2024-02-16 NOTE — Anesthesia Procedure Notes (Signed)
 Arterial Line Insertion Start/End4/30/2025 1:45 AM, 02/16/2024 2:00 AM Performed by: CRNA  Patient location: Pre-op. Preanesthetic checklist: patient identified, IV checked, site marked, risks and benefits discussed, surgical consent, monitors and equipment checked, pre-op evaluation, timeout performed and anesthesia consent Lidocaine 1% used for infiltration Left, radial was placed Catheter size: 20 G Hand hygiene performed , maximum sterile barriers used  and Seldinger technique used Allen's test indicative of satisfactory collateral circulation Attempts: 2 Procedure performed without using ultrasound guided technique. Following insertion, dressing applied and Biopatch. Post procedure assessment: normal and unchanged  Patient tolerated the procedure well with no immediate complications.

## 2024-02-16 NOTE — Plan of Care (Signed)

## 2024-02-16 NOTE — Op Note (Signed)
 Brief history: The patient is a 73 year old white male who broke his neck in 17.  He had a posterior cervical wire and acrylic fusion at that time.  He has had chronic neck pain.  He fell suffering a C6 burst fracture with subluxation.  I recommended surgery.  I performed a C5 and C6 corpectomy with anterior instrumentation and fusion 2 days ago.  I recommended a second stage posterior cervical instrumentation and fusion.  The patient has decided proceed with the surgery.  Preop diagnosis: C6 burst fracture, subluxation, cervicalgia  Postop diagnosis: The same  Procedure: Posterior cervical instrumentation fusion C2-T1 with Zimmer titanium screws and rods using O-arm spinal navigation; C2-3, C3-4, C4-5, C5-6, C6-7 and C7-T1 posterior lateral arthrodesis with regrow bone graft extender: Exploration of posterior cervical fusion  Surgeon: Dr. Pleasant Brilliant  Assistant: Dr. Audie Bleacher and Marlana Silvan, NP  Anesthesia: General Tracheal  Estimated blood loss: 125 cc  Specimens: None  Drains: 1 medium Hemovac drain  Complications: None  Description of procedure: The patient was brought to the operating room by the anesthesia team.  General endotracheal anesthesia was induced.  I then applied the Mayfield 3 point headrest to his calvarium.  He was carefully turned to the prone position on the chest rolls.  The patient's suboccipital region was shaved with a clippers in this region the posterior cervical and upper thoracic region was then prepared with Betadine scrub of Betadine solution.  Sterile drapes were applied.  I then injected the area to be incised with Marcaine with epinephrine solution.  I used a scalpel to incise through the patient's previous posterior cervical incision.  I used electrocautery to perform a bilateral subperiosteal dissection exposing the spinous process, facets and the old fusion mass/methylmethacrylate at C2-3, C3-4, C4-5, C5-6, C6-7 and exposing the spinous process  and lamina at T1 and T2.  We used the cerebellar retractors for exposure.  I then applied the reference frame to the patient's C2 spinous process and we obtained intraoperative CAT scan.  The resolution of the CAT scan was suboptimal because of the patient's previous hardware/fusions, etc.  Under O-arm navigation I cannulated the bilateral C7 and T1 pedicles.  I then drilled the pedicles.  I then probed inside the drill holes and were not cortical breaches.  I inserted a 4.5 x 30 mm pedicle screw bilaterally into the T1 pedicles and a 3.5 x 25 mm pedicle screws into the bilateral C7 pedicles.  We got good bony purchase.  We now turned our attention to placement of the lateral mass screws.  The patient's bone at the previous fusion site was extremely soft.  Under O-arm guidance I can cannulated the lateral masses at C3 and C4 bilaterally.  I cannulated the bilateral C2 pars.  I again ruled out cortical breaches with the probe.  I inserted a 3.5 x 20 mm screw into the pars bilaterally at C2 and got fairly good bony purchase.  The lateral masses at C3 not hold a screw because the bone was so soft.  I remove the screws.  I was able to get a screw in the lateral mass on the left at C4.  I then connected the screws from C2-T1 with a rod.  We secured the rod in place with the caps.  We placed a cross connector between the rods.  We tightened all the caps and connectors.  This completed the instrumentation from C2-T1 bilaterally.  We now turned attention to the posterior arthrodesis at C2-3, C3-4,  C4-5, C5-6, C6-7 and C7-T1.  I used a high-speed drill to decorticate the lateral masses and facets.  I placed intradural bone graft extender over these posterior lateral structures completing the arthrodesis at C2-3, C3-4, C4-5, C5-6, C6-7 and C7-T1 bilaterally.  We then obtained hemostasis with bipolar cautery.  We irrigated the wound out with Vashe solution.  I injected Exparel.  I placed a medium Hemovac drain.  We  tunneled out through a separate stab wound.  We then removed the retractors.  We reapproximated patient's cervical thoracic fascia with interrupted 0 Vicryl suture.  We reapproximated the subcutaneous tissue with interrupted 2-0 Vicryl suture.  We reapproximated the skin with Steri-Strips and benzoin.  The wound was then coated with bacitracin ointment.  A sterile dressing was applied.  The drapes were removed.  We then carefully returned the patient to the supine position.  I then removed the Mayfield 3 point headrest from the patient's calvarium.  I reapplied the patient's cervical collar.  By report all sponge, instrument, and needle counts were correct at the end of this case.

## 2024-02-16 NOTE — Anesthesia Preprocedure Evaluation (Signed)
 Anesthesia Evaluation  Patient identified by MRN, date of birth, ID band Patient awake    Reviewed: Allergy & Precautions, NPO status , Patient's Chart, lab work & pertinent test results  Airway Mallampati: III  TM Distance: >3 FB Neck ROM: Limited    Dental  (+) Teeth Intact, Dental Advisory Given   Pulmonary neg pulmonary ROS   Pulmonary exam normal breath sounds clear to auscultation       Cardiovascular hypertension, Pt. on medications + CAD  Normal cardiovascular exam Rhythm:Regular Rate:Normal     Neuro/Psych  Headaches    GI/Hepatic negative GI ROS, Neg liver ROS,,,  Endo/Other  negative endocrine ROS    Renal/GU negative Renal ROS     Musculoskeletal  (+) Arthritis ,    Abdominal   Peds  Hematology negative hematology ROS (+)   Anesthesia Other Findings Day of surgery medications reviewed with the patient.  Reproductive/Obstetrics                             Anesthesia Physical Anesthesia Plan  ASA: 2  Anesthesia Plan: General   Post-op Pain Management: Ofirmev  IV (intra-op)*   Induction: Intravenous  PONV Risk Score and Plan: 2 and Dexamethasone and Ondansetron  Airway Management Planned: Oral ETT and Video Laryngoscope Planned  Additional Equipment: Arterial line  Intra-op Plan:   Post-operative Plan: Extubation in OR  Informed Consent: I have reviewed the patients History and Physical, chart, labs and discussed the procedure including the risks, benefits and alternatives for the proposed anesthesia with the patient or authorized representative who has indicated his/her understanding and acceptance.     Dental advisory given  Plan Discussed with: CRNA  Anesthesia Plan Comments: (2ND piv AFTER INDUCTION)       Anesthesia Quick Evaluation

## 2024-02-16 NOTE — Progress Notes (Signed)
 Updated wife via telephone patient will be switching rooms after room on 4NP is cleaned.  Will call her again when the room is ready.

## 2024-02-16 NOTE — Anesthesia Procedure Notes (Signed)
 Procedure Name: Intubation Date/Time: 02/16/2024 2:13 PM  Performed by: Chanda Combes, CRNAPre-anesthesia Checklist: Patient identified, Emergency Drugs available, Suction available and Patient being monitored Patient Re-evaluated:Patient Re-evaluated prior to induction Oxygen Delivery Method: Circle System Utilized Preoxygenation: Pre-oxygenation with 100% oxygen Induction Type: IV induction Ventilation: Mask ventilation without difficulty Laryngoscope Size: Glidescope and 3 Grade View: Grade I Tube type: Oral Tube size: 7.5 mm Number of attempts: 1 Airway Equipment and Method: Stylet and Oral airway Placement Confirmation: ETT inserted through vocal cords under direct vision, positive ETCO2 and breath sounds checked- equal and bilateral Secured at: 23 cm Tube secured with: Tape Dental Injury: Teeth and Oropharynx as per pre-operative assessment

## 2024-02-16 NOTE — Progress Notes (Signed)
 Subjective: The patient is alert and pleasant.  He is in no apparent distress.  Objective: Vital signs in last 24 hours: Temp:  [97.5 F (36.4 C)-98.5 F (36.9 C)] 98.5 F (36.9 C) (04/30 1146) Pulse Rate:  [71-91] 74 (04/30 1146) Resp:  [17-18] 18 (04/30 1146) BP: (99-115)/(67-94) 101/81 (04/30 1146) SpO2:  [98 %-100 %] 98 % (04/30 1146) Weight:  [72.6 kg] 72.6 kg (04/30 1146) Estimated body mass index is 22.96 kg/m as calculated from the following:   Height as of this encounter: 5\' 10"  (1.778 m).   Weight as of this encounter: 72.6 kg.   Intake/Output from previous day: 04/29 0701 - 04/30 0700 In: 963 [P.O.:863; IV Piggyback:100] Out: -  Intake/Output this shift: No intake/output data recorded.  Physical exam patient is alert and pleasant.  He is moving all 4 extremities well.  Lab Results: Recent Labs    02/14/24 1408 02/16/24 1219  WBC 9.4  --   HGB 14.2 12.6*  HCT 42.3 37.0*  PLT 195  --    BMET Recent Labs    02/14/24 1408 02/16/24 1219  NA 131* 134*  K 4.6 4.8  CL 98 101  CO2 21*  --   GLUCOSE 106* 108*  BUN 8 17  CREATININE 0.63 0.70  CALCIUM 9.2  --     Studies/Results: DG Cervical Spine 1 View Result Date: 02/14/2024 CLINICAL DATA:  Anterior cervical corpectomy and cervical anterior instrumentation EXAM: DG CERVICAL SPINE - 1 VIEW COMPARISON:  CT cervical spine 02/11/2024 FLUOROSCOPY TIME:  Fluoroscopy Time:  6.5 seconds Radiation Exposure Index (if provided by the fluoroscopic device): 1.44 mGy Number of Acquired Spot Images: 1 FINDINGS: Single intraoperative fluoroscopic spot images are provided without a radiologist present demonstrating a ACDF of C4-C7 with C5-C6 corpectomy. IMPRESSION: Intraoperative fluoroscopic spot images during ACDF of C4-C7 with C5-C6 corpectomy. Electronically Signed   By: Rozell Cornet M.D.   On: 02/14/2024 22:08   DG Cervical Spine 2 or 3 views Result Date: 02/14/2024 CLINICAL DATA:  Intraoperative images used for  localization. EXAM: CERVICAL SPINE - 2-3 VIEW COMPARISON:  None Available. FINDINGS: Three intraoperative images of the lateral cervical spine used for localization. IMPRESSION: Intraoperative images used for localization. Electronically Signed   By: Rozell Cornet M.D.   On: 02/14/2024 22:06   DG C-Arm 1-60 Min-No Report Result Date: 02/14/2024 Fluoroscopy was utilized by the requesting physician.  No radiographic interpretation.    Assessment/Plan: Postop day #2: The patient is doing well.  I have answered all his questions regarding a posterior cervical instrumentation fusion.  He wants to proceed with surgery.  LOS: 2 days     [name] 02/16/2024, 1:45 PM

## 2024-02-17 MED ORDER — PANTOPRAZOLE SODIUM 40 MG PO TBEC
40.0000 mg | DELAYED_RELEASE_TABLET | Freq: Every day | ORAL | Status: DC
  Administered 2024-02-17 – 2024-02-21 (×5): 40 mg via ORAL
  Filled 2024-02-17 (×5): qty 1

## 2024-02-17 NOTE — Anesthesia Postprocedure Evaluation (Signed)
 Anesthesia Post Note  Patient: Brian Murray.  Procedure(s) Performed: Posterior Cervical Instrumentation fusion Cervical Two-Thoracic One APPLICATION OF O-ARM     Patient location during evaluation: PACU Anesthesia Type: General Level of consciousness: awake and alert Pain management: pain level controlled Vital Signs Assessment: post-procedure vital signs reviewed and stable Respiratory status: spontaneous breathing, nonlabored ventilation, respiratory function stable and patient connected to nasal cannula oxygen Cardiovascular status: blood pressure returned to baseline and stable Postop Assessment: no apparent nausea or vomiting Anesthetic complications: no   No notable events documented.  Last Vitals:  Vitals:   02/17/24 1633 02/17/24 1932  BP: 111/71 126/84  Pulse: 86 86  Resp: 13 14  Temp: 36.8 C 36.8 C  SpO2: 99% 99%    Last Pain:  Vitals:   02/17/24 2044  TempSrc:   PainSc: 7     LLE Motor Response: Purposeful movement (02/17/24 2053) LLE Sensation: Full sensation (02/17/24 2053) RLE Motor Response: Purposeful movement (02/17/24 2053) RLE Sensation: Full sensation (02/17/24 2053)      Theotis Flake P Anthonyjames Bargar

## 2024-02-17 NOTE — Progress Notes (Signed)
 Inpatient Rehab Admissions Coordinator:   Consult received and chart reviewed.  Note OT recommending no f/u.  No PT eval, however to qualify for CIR pt's must require intensive therapy in at least 2 disciplines.  Would not pursue CIR at this time.   Loye Rumble, PT, DPT Admissions Coordinator [phone] 02/17/24  4:07 PM

## 2024-02-17 NOTE — Plan of Care (Signed)
   Problem: Education: Goal: Knowledge of General Education information will improve Description Including pain rating scale, medication(s)/side effects and non-pharmacologic comfort measures Outcome: Progressing   Problem: Health Behavior/Discharge Planning: Goal: Ability to manage health-related needs will improve Outcome: Progressing

## 2024-02-17 NOTE — Care Management Important Message (Signed)
 Important Message  Patient Details  Name: Brian Murray. MRN: [id number] Date of Birth: 08-Jun-1951   Important Message Given:  Yes - Medicare IM     Felix Host 02/17/2024, 4:07 PM

## 2024-02-17 NOTE — Evaluation (Signed)
 Occupational Therapy Re-Evaluation Patient Details Name: Brian Murray. MRN: [id number] DOB: October 19, 1951 Today's Date: 02/17/2024   History of Present Illness   Pt is a 73 y/o male s/p C5-C6 corpectomy instrumentation fusion from C4-C7 with plan for posterior cervical fusion C5 on 4/30. PMH: Chronic low back pain, HTN, rocky mountain spotted fever, previous posterior interspinous wiring and methylmethacrylate fusion d/t to neck fracture in 1977.     Clinical Impressions Pt was independent prior to admission, endorses falls. Pt presents with post operative pain and impaired standing balance. Without AD pt ambulates tentatively. Offered pt a RW with improved confidence and stability. Reinforced cervical precautions, compensatory strategies for ADLs, IADLs to avoid, proper position on cervical brace and fall prevention. Recommended reacher and tub transfer bench with wife's supervision. Pt receptive to all education an wife present. Placed order for PT eval.      If plan is discharge home, recommend the following:   Assistance with cooking/housework;Assist for transportation;A little help with bathing/dressing/bathroom;Help with stairs or ramp for entrance     Functional Status Assessment   Patient has had a recent decline in their functional status and demonstrates the ability to make significant improvements in function in a reasonable and predictable amount of time.     Equipment Recommendations   Other (comment) (RW)     Recommendations for Other Services         Precautions/Restrictions   Precautions Precautions: Cervical;Fall Precaution Booklet Issued: Yes (comment) Recall of Precautions/Restrictions: Intact Precaution/Restrictions Comments: pt and wife educated on cervical precautions, compensatory techniques for ADL completion, sitting and standing posture awareness, ROM restrictions. Required Braces or Orthoses: Cervical Brace Cervical Brace: Hard  collar Restrictions Weight Bearing Restrictions Per Provider Order: No     Mobility Bed Mobility               General bed mobility comments: received in chair    Transfers Overall transfer level: Needs assistance Equipment used: None, Rolling walker (2 wheels) Transfers: Sit to/from Stand Sit to Stand: Contact guard assist           General transfer comment: slow and tentative      Balance Overall balance assessment: Needs assistance   Sitting balance-Leahy Scale: Good       Standing balance-Leahy Scale: Fair                             ADL either performed or assessed with clinical judgement   ADL Overall ADL's : Needs assistance/impaired Eating/Feeding: Independent;Sitting   Grooming: Contact guard assist;Standing Grooming Details (indicate cue type and reason): educated in two cup method for oral care, use of washcloth on face Upper Body Bathing: Minimal assistance   Lower Body Bathing: Minimal assistance;Sit to/from stand   Upper Body Dressing : Set up;Sitting   Lower Body Dressing: Minimal assistance;Sit to/from stand   Toilet Transfer: Contact guard assist;Ambulation;Rolling walker (2 wheels)           Functional mobility during ADLs: Contact guard assist;Rolling walker (2 wheels) General ADL Comments: long conversation with pt about IADLs to avoid and fall prevention     Vision Baseline Vision/History: 1 Wears glasses Ability to See in Adequate Light: 0 Adequate Patient Visual Report: Other (comment) (reports depth perception deficits at baseline)       Perception         Praxis         Pertinent Vitals/Pain Pain Assessment Pain Assessment:  Faces Faces Pain Scale: Hurts a little bit Pain Location: neck Pain Descriptors / Indicators: Sore, Discomfort Pain Intervention(s): Monitored during session, Repositioned, Premedicated before session     Extremity/Trunk Assessment Upper Extremity Assessment Upper  Extremity Assessment: Overall WFL for tasks assessed;Right hand dominant   Lower Extremity Assessment Lower Extremity Assessment: Generalized weakness   Cervical / Trunk Assessment Cervical / Trunk Assessment: Neck Surgery   Communication Communication Communication: No apparent difficulties   Cognition Arousal: Alert Behavior During Therapy: WFL for tasks assessed/performed Cognition: No apparent impairments                               Following commands: Intact       Cueing  General Comments   Cueing Techniques: Verbal cues      Exercises     Shoulder Instructions      Home Living Family/patient expects to be discharged to:: Private residence Living Arrangements: Spouse/significant other Available Help at Discharge: Family;Available 24 hours/day Type of Home: House Home Access: Stairs to enter [employer] of Steps: 3 Entrance Stairs-Rails: Right Home Layout: One level     Bathroom Shower/Tub: Chief Strategy Officer: Standard     Home Equipment: None          Prior Functioning/Environment Prior Level of Function : Independent/Modified Independent;Driving;History of Falls (last six months)                    OT Problem List: Pain;Impaired balance (sitting and/or standing);Decreased knowledge of use of DME or AE   OT Treatment/Interventions: Self-care/ADL training;Patient/family education;Therapeutic activities;DME and/or AE instruction;Balance training      OT Goals(Current goals can be found in the care plan section)   Acute Rehab OT Goals OT Goal Formulation: With patient Time For Goal Achievement: 03/03/24 Potential to Achieve Goals: Good   OT Frequency:  Min 2X/week    Co-evaluation              AM-PAC OT "6 Clicks" Daily Activity     Outcome Measure Help from another person eating meals?: None Help from another person taking care of personal grooming?: A Little Help from another person  toileting, which includes using toliet, bedpan, or urinal?: A Little Help from another person bathing (including washing, rinsing, drying)?: A Little Help from another person to put on and taking off regular upper body clothing?: A Little Help from another person to put on and taking off regular lower body clothing?: A Little 6 Click Score: 19   End of Session Equipment Utilized During Treatment: Rolling walker (2 wheels);Cervical collar;Gait belt Nurse Communication: Mobility status  Activity Tolerance: Patient tolerated treatment well Patient left: in chair;with call bell/phone within reach  OT Visit Diagnosis: Pain;Unsteadiness on feet (R26.81);Other abnormalities of gait and mobility (R26.89);History of falling (Z91.81);Muscle weakness (generalized) (M62.81)                Time: 1610-9604 OT Time Calculation (min): 45 min Charges:  OT General Charges $OT Visit: 1 Visit OT Evaluation $OT Re-eval: 1 Re-eval OT Treatments $Self Care/Home Management : 23-37 mins  Avanell Leigh, OTR/L Acute Rehabilitation Services Office: [phone]  Jonette Nestle 02/17/2024, 9:52 AM

## 2024-02-17 NOTE — Plan of Care (Signed)

## 2024-02-17 NOTE — Progress Notes (Signed)
 Subjective: The patient is alert and pleasant.  His neck is appropriately sore.  His wife is at the bedside.  Objective: Vital signs in last 24 hours: Temp:  [98 F (36.7 C)-98.9 F (37.2 C)] 98.3 F (36.8 C) (05/01 0250) Pulse Rate:  [74-109] 104 (04/30 2335) Resp:  [9-20] 12 (04/30 2312) BP: (99-142)/(60-94) 100/60 (05/01 0250) SpO2:  [94 %-100 %] 97 % (04/30 2312) Arterial Line BP: (150)/(70) 150/70 (04/30 1830) Weight:  [72.6 kg] 72.6 kg (04/30 1146) Estimated body mass index is 22.96 kg/m as calculated from the following:   Height as of this encounter: 5\' 10"  (1.778 m).   Weight as of this encounter: 72.6 kg.   Intake/Output from previous day: 04/30 0701 - 05/01 0700 In: 1897.7 [I.V.:1577.7; IV Piggyback:200] Out: 1575 [Urine:1425; Drains:25; Blood:125] Intake/Output this shift: No intake/output data recorded.  Physical exam the patient is alert and pleasant.  He is moving all 4 extremities well. His drain put out 25 cc Lab Results: Recent Labs    02/14/24 1408 02/16/24 1219  WBC 9.4  --   HGB 14.2 12.6*  HCT 42.3 37.0*  PLT 195  --    BMET Recent Labs    02/14/24 1408 02/16/24 1219  NA 131* 134*  K 4.6 4.8  CL 98 101  CO2 21*  --   GLUCOSE 106* 108*  BUN 8 17  CREATININE 0.63 0.70  CALCIUM 9.2  --     Studies/Results: DG Cervical Spine 1 View Result Date: 02/16/2024 CLINICAL DATA:  Elective surgery, intraoperative localization. EXAM: DG CERVICAL SPINE - 1 VIEW COMPARISON:  02/14/2024. FINDINGS: Single intraoperative cross-table lateral view of the cervical spine shows a surgical instrument tip anterior to C3. Partially imaged lower cervical anterior fusion. IMPRESSION: Intraoperative localization at C3. Electronically Signed   By: Shearon Denis M.D.   On: 02/16/2024 18:38   DG O-ARM IMAGE ONLY/NO REPORT Result Date: 02/16/2024 There is no Radiologist interpretation  for this exam.   Assessment/Plan: Postop day #3/1: The patient seems to be  recovering well.  He may need rehab.  I will ask rehab to see the patient.  We discussed his soft bones and the importance of limiting his neck motions and faithfully wearing his collar to allow his fusions to heal.  Otherwise he may need a halo or occipital cervical fusion.  I have answered all the questions.  LOS: 3 days     [name] 02/17/2024, 7:51 AM     Patient ID: Brian Murray., male   DOB: [birth year], 73 y.o.   MRN: [id number]

## 2024-02-18 NOTE — Progress Notes (Signed)
 Occupational Therapy Treatment Patient Details Name: Brian Murray. MRN: [id number] DOB: 1951/01/06 Today's Date: 02/18/2024   History of present illness Pt is a 73 y/o male who presented for C5-C6 corpectomy instrumentation fusion from C4-C7 4/28 and posterior cervical instrumentation fusion C2-T1 4/30. PMH: Chronic low back pain, HTN, rocky mountain spotted fever, previous posterior interspinous wiring and methylmethacrylate fusion d/t to neck fracture in 1977.   OT comments  Pt. Seen for skilled OT treatment session with wife present.  Pt. Able to state precautions and reviewed with him and spouse ways to ensure they are implemented during ADLs.  LB dressing with MIN A and education on compensatory strategies.  In room ambulation with no DME to/from b.room for simulated toileting task, and then stood for grooming task at sink.  Pt. Highly motivated and very complaint with precautions with stated goals and to heal and resume plans with his family in the up coming months.  Cont. With acute OT POC.        If plan is discharge home, recommend the following:  Assistance with cooking/housework;Assist for transportation;A little help with bathing/dressing/bathroom;Help with stairs or ramp for entrance   Equipment Recommendations       Recommendations for Other Services      Precautions / Restrictions Precautions Precautions: Cervical;Fall Recall of Precautions/Restrictions: Intact Precaution/Restrictions Comments: reviewed precautions Required Braces or Orthoses: Cervical Brace Cervical Brace: Hard collar Restrictions Other Position/Activity Restrictions: 4/30 order states brace can be removed in bed, ambulate to b.room without, and apply in sitting.  5/1 surgery notes state "limiting his neck motions and faithfully wearing his collar to allow his fusions to heal" secure chat on 5/2 for clarification       Mobility Bed Mobility               General bed mobility comments:  seated in recliner    Transfers Overall transfer level: Needs assistance Equipment used: None Transfers: Sit to/from Stand Sit to Stand: Contact guard assist           General transfer comment: Slow to power up to stand and cautious, no LOB, CGA for safety. encouragement to place hands on thighs for maintaining precautions during sit/stand and stand/sit. good control     Balance                                           ADL either performed or assessed with clinical judgement   ADL Overall ADL's : Needs assistance/impaired     Grooming: Contact guard assist;Standing;Wash/dry hands           Upper Body Dressing : Set up;Sitting;Cueing for compensatory techniques;Cueing for sequencing Upper Body Dressing Details (indicate cue type and reason): reviewed placing BUES in the sleeves and holding onto the shirt and placing head in to maintain precautions Lower Body Dressing: Sitting/lateral leans;Sit to/from stand;Cueing for sequencing;Cueing for compensatory techniques;Minimal assistance Lower Body Dressing Details (indicate cue type and reason): pt. able to figure 4 to reach each LE.  reviewed lateral leans to pull each side up, wife can assist if garments need to be pulled up in the back to aide in maintaining precautions Toilet Transfer: Contact guard assist;Ambulation Toilet Transfer Details (indicate cue type and reason): no DME pt. able to ambulate to b.room, including management of door handle without LOB         Functional mobility during  ADLs: Contact guard assist General ADL Comments: reviewed precautions at length with examples of how to maintain them.  requested clarification on hard collar precuations. pt. plans to leave on 24/7 per his conversation with surgeon on 5/1 (see notes)    Extremity/Trunk Assessment              Vision       Perception     Praxis     Communication Communication Communication: No apparent difficulties    Cognition Arousal: Alert Behavior During Therapy: WFL for tasks assessed/performed Cognition: No apparent impairments                                        Cueing   Cueing Techniques: Verbal cues  Exercises      Shoulder Instructions       General Comments      Pertinent Vitals/ Pain       Pain Assessment Pain Assessment: No/denies pain  Home Living                                          Prior Functioning/Environment              Frequency  Min 2X/week        Progress Toward Goals  OT Goals(current goals can now be found in the care plan section)  Progress towards OT goals: Progressing toward goals     Plan      Co-evaluation                 AM-PAC OT "6 Clicks" Daily Activity     Outcome Measure   Help from another person eating meals?: None Help from another person taking care of personal grooming?: A Little Help from another person toileting, which includes using toliet, bedpan, or urinal?: A Little Help from another person bathing (including washing, rinsing, drying)?: A Little Help from another person to put on and taking off regular upper body clothing?: A Little Help from another person to put on and taking off regular lower body clothing?: A Little 6 Click Score: 19    End of Session Equipment Utilized During Treatment: Cervical collar  OT Visit Diagnosis: Pain;Unsteadiness on feet (R26.81);Other abnormalities of gait and mobility (R26.89);History of falling (Z91.81);Muscle weakness (generalized) (M62.81) Pain - Right/Left: Left Pain - part of body: Arm   Activity Tolerance     Patient Left in chair;with call bell/phone within reach;with family/visitor present   Nurse Communication Other (comment) (rn states ok to work with pt.)        Time: 1237-1309 OT Time Calculation (min): 32 min  Charges: OT General Charges $OT Visit: 1 Visit OT Treatments $Self Care/Home Management : 23-37  mins  Howell Macintosh, COTA/L Acute Rehabilitation [phone]   Leory Rands [place]-COTA/L  02/18/2024, 3:27 PM

## 2024-02-18 NOTE — Evaluation (Signed)
 Physical Therapy Evaluation Patient Details Name: Brian Murray. MRN: [id number] DOB: 02-02-1951 Today's Date: 02/18/2024  History of Present Illness  Pt is a 73 y/o male who presented for C5-C6 corpectomy instrumentation fusion from C4-C7 4/28 and posterior cervical instrumentation fusion C2-T1 4/30. PMH: Chronic low back pain, HTN, rocky mountain spotted fever, previous posterior interspinous wiring and methylmethacrylate fusion d/t to neck fracture in 1977.   Clinical Impression  Pt presents with condition above and deficits mentioned below, see PT Problem List. PTA, he was independent without DME, living with his wife in a 1-level house with 3 STE. He reports a recent fall. Currently, pt demonstrates deficits in balance and activity tolerance and is limited in mobility by pain. He has baseline gait deviations from a prior crush injury to his L heel, also impacting his balance. At this time, the pt is able to perform all functional mobility, including ambulating without UE support and stairs with x1 handrail support without LOB with CGA for safety only. Educated pt and wife on car transfers, taking longer walks >/= 3x/day, using RW or having someone guard him as needed to ensure safety, and having wife guard him with mobility in halls while here using gait belt provided (RN clearance obtained for this). They verbalized understanding of all education. The pt could benefit from follow-up with OPPT (once cleared by MD) to address his balance deficits to reduce his risk for future falls and injury. Will continue to follow acutely.      If plan is discharge home, recommend the following: A little help with walking and/or transfers;A little help with bathing/dressing/bathroom;Assistance with cooking/housework;Assist for transportation;Help with stairs or ramp for entrance   Can travel by private vehicle        Equipment Recommendations Rolling walker (2 wheels)  Recommendations for Other  Services       Functional Status Assessment Patient has had a recent decline in their functional status and demonstrates the ability to make significant improvements in function in a reasonable and predictable amount of time.     Precautions / Restrictions Precautions Precautions: Cervical;Fall Precaution Booklet Issued: Yes (comment) Recall of Precautions/Restrictions: Intact Precaution/Restrictions Comments: reviewed precautions Required Braces or Orthoses: Cervical Brace Cervical Brace: Hard collar (Brace application in sitting, may remove in bed, to shower, and may ambulate to bathroom without brace) Restrictions Weight Bearing Restrictions Per Provider Order: No      Mobility  Bed Mobility               General bed mobility comments: sitting EOB upon arrival, sitting in recliner end of session    Transfers Overall transfer level: Needs assistance Equipment used: None Transfers: Sit to/from Stand Sit to Stand: Contact guard assist           General transfer comment: Slow to power up to stand and cautious, no LOB, CGA for safety    Ambulation/Gait Ambulation/Gait assistance: Contact guard assist Gait Distance (Feet): 360 Feet Assistive device: None Gait Pattern/deviations: Step-through pattern, Narrow base of support, Drifts right/left Gait velocity: reduced Gait velocity interpretation: 1.31 - 2.62 ft/sec, indicative of limited community ambulator   General Gait Details: Pt ambulates with cautious L initial contact, reportedly baseline due to hx of L heel crush injury without repair years ago. Moments of narrow BOS with mild lateral swaying/drifting, but no LOB, CGA for safety. Pt able to weave in/out without LOB also. Encouraged pt to widen his BOS to improve his balance as able.  Stairs Stairs: Yes  Stairs assistance: Contact guard assist Stair Management: One rail Right, One rail Left, Alternating pattern, Forwards Number of Stairs: 10 General stair  comments: Ascends with R rail and descends with L to simulate location of wall support on stairs at home. No LOB, CGA for safety  Wheelchair Mobility     Tilt Bed    Modified Rankin (Stroke Patients Only)       Balance Overall balance assessment: Mild deficits observed, not formally tested                                           Pertinent Vitals/Pain Pain Assessment Pain Assessment: Faces Faces Pain Scale: Hurts little more Pain Location: neck, back Pain Descriptors / Indicators: Sore, Discomfort, Operative site guarding Pain Intervention(s): Limited activity within patient's tolerance, Monitored during session, Premedicated before session, Repositioned    Home Living Family/patient expects to be discharged to:: Private residence Living Arrangements: Spouse/significant other Available Help at Discharge: Family;Available 24 hours/day Type of Home: House Home Access: Stairs to enter Entrance Stairs-Rails: Right Entrance Stairs-Number of Steps: 3   Home Layout: One level Home Equipment: None      Prior Function Prior Level of Function : Independent/Modified Independent;Driving;History of Falls (last six months)                     Extremity/Trunk Assessment   Upper Extremity Assessment Upper Extremity Assessment: Defer to OT evaluation    Lower Extremity Assessment Lower Extremity Assessment: LLE deficits/detail LLE Deficits / Details: reports hx of L heel being crushed without repair, impacting his gait; MMT scores grossly 5/5 bil, sensation intact bil    Cervical / Trunk Assessment Cervical / Trunk Assessment: Neck Surgery  Communication   Communication Communication: No apparent difficulties    Cognition Arousal: Alert Behavior During Therapy: WFL for tasks assessed/performed   PT - Cognitive impairments: Memory                       PT - Cognition Comments: cannot directly state back his x3 precautions but seems  aware of them to state what tasks he cannot do due to his precations Following commands: Intact       Cueing Cueing Techniques: Verbal cues     General Comments General comments (skin integrity, edema, etc.): educated pt and wife on car transfers, taking longer walks >/= 3x/day, using RW or having someone guard him as needed to ensure safety, and having wife guard him with mobility in halls while here using gait belt provided (RN clearance obtained for this); instructed them not to do stairs while here without therapy support; they verbalized understanding of all education    Exercises     Assessment/Plan    PT Assessment Patient needs continued PT services  PT Problem List Decreased activity tolerance;Decreased balance;Decreased mobility;Decreased knowledge of precautions       PT Treatment Interventions DME instruction;Gait training;Stair training;Functional mobility training;Therapeutic activities;Therapeutic exercise;Balance training;Neuromuscular re-education;Patient/family education    PT Goals (Current goals can be found in the Care Plan section)  Acute Rehab PT Goals Patient Stated Goal: to not fall or need another surgery PT Goal Formulation: With patient/family Time For Goal Achievement: 03/03/24 Potential to Achieve Goals: Good    Frequency Min 5X/week     Co-evaluation  AM-PAC PT "6 Clicks" Mobility  Outcome Measure Help needed turning from your back to your side while in a flat bed without using bedrails?: A Little Help needed moving from lying on your back to sitting on the side of a flat bed without using bedrails?: A Little Help needed moving to and from a bed to a chair (including a wheelchair)?: A Little Help needed standing up from a chair using your arms (e.g., wheelchair or bedside chair)?: A Little Help needed to walk in hospital room?: A Little Help needed climbing 3-5 steps with a railing? : A Little 6 Click Score: 18    End of  Session Equipment Utilized During Treatment: Gait belt Activity Tolerance: Patient tolerated treatment well Patient left: in chair;with call bell/phone within reach;with family/visitor present Nurse Communication: Mobility status;Other (comment) (no chair alarm and wife ok to take pt for walks while here, RN in agreement) PT Visit Diagnosis: Unsteadiness on feet (R26.81);Other abnormalities of gait and mobility (R26.89);Other symptoms and signs involving the nervous system (R29.898)    Time: 4098-1191 PT Time Calculation (min) (ACUTE ONLY): 22 min   Charges:   PT Evaluation $PT Eval Moderate Complexity: 1 Mod   PT General Charges $$ ACUTE PT VISIT: 1 Visit         Vernida Goodie, PT, DPT Acute Rehabilitation Services  Office: [phone]   Ellyn Hack 02/18/2024, 8:55 AM

## 2024-02-18 NOTE — Plan of Care (Signed)

## 2024-02-18 NOTE — Plan of Care (Signed)

## 2024-02-18 NOTE — Progress Notes (Signed)
 Subjective: The patient is alert and pleasant.  His neck is appropriately sore.  His wife is at the bedside.  He is concerned about pain management at he has been on pain medications chronically for years.  Objective: Vital signs in last 24 hours: Temp:  [97.7 F (36.5 C)-98.3 F (36.8 C)] 98.1 F (36.7 C) (05/02 0347) Pulse Rate:  [80-91] 84 (05/02 0347) Resp:  [13-16] 16 (05/02 0347) BP: (109-141)/(71-84) 141/76 (05/01 2313) SpO2:  [96 %-99 %] 99 % (05/01 2313) Estimated body mass index is 22.96 kg/m as calculated from the following:   Height as of this encounter: 5\' 10"  (1.778 m).   Weight as of this encounter: 72.6 kg.   Intake/Output from previous day: 05/01 0701 - 05/02 0700 In: 1641.3 [P.O.:840; I.V.:801.3] Out: 160 [Drains:160] Intake/Output this shift: Total I/O In: -  Out: 80 [Drains:80]  Physical exam the patient is alert and pleasant.  He is moving all 4 extremities well.  His cervical collar is in place.  Lab Results: Recent Labs    02/16/24 1219  HGB 12.6*  HCT 37.0*   BMET Recent Labs    02/16/24 1219  NA 134*  K 4.8  CL 101  GLUCOSE 108*  BUN 17  CREATININE 0.70    Studies/Results: DG Cervical Spine 1 View Result Date: 02/16/2024 CLINICAL DATA:  Elective surgery, intraoperative localization. EXAM: DG CERVICAL SPINE - 1 VIEW COMPARISON:  02/14/2024. FINDINGS: Single intraoperative cross-table lateral view of the cervical spine shows a surgical instrument tip anterior to C3. Partially imaged lower cervical anterior fusion. IMPRESSION: Intraoperative localization at C3. Electronically Signed   By: Shearon Denis M.D.   On: 02/16/2024 18:38   DG O-ARM IMAGE ONLY/NO REPORT Result Date: 02/16/2024 There is no Radiologist interpretation  for this exam.   Assessment/Plan: Postop day #4/2: We will remove his Hemovac.  We will continue to mobilize him with PT.  Perhaps he can go home over the weekend.  LOS: 4 days     [name] 02/18/2024,  6:55 AM     Patient ID: Brian Murray., male   DOB: 20-Oct-1950, 73 y.o.   MRN: [id number]

## 2024-02-19 MED ORDER — KETOROLAC TROMETHAMINE 15 MG/ML IJ SOLN
15.0000 mg | Freq: Four times a day (QID) | INTRAMUSCULAR | Status: DC | PRN
  Administered 2024-02-19 – 2024-02-21 (×6): 15 mg via INTRAVENOUS
  Filled 2024-02-19 (×6): qty 1

## 2024-02-19 MED ORDER — POLYETHYLENE GLYCOL 3350 17 G PO PACK
17.0000 g | PACK | Freq: Every day | ORAL | Status: DC
  Administered 2024-02-19 – 2024-02-21 (×3): 17 g via ORAL
  Filled 2024-02-19 (×3): qty 1

## 2024-02-19 MED ORDER — MELATONIN 3 MG PO TABS
3.0000 mg | ORAL_TABLET | Freq: Every day | ORAL | Status: DC
  Administered 2024-02-19 – 2024-02-21 (×3): 3 mg via ORAL
  Filled 2024-02-19 (×3): qty 1

## 2024-02-19 NOTE — Plan of Care (Signed)

## 2024-02-19 NOTE — Progress Notes (Signed)
 PT Cancellation Note  Patient Details Name: Brian Murray. MRN: [id number] DOB: 1951-05-24   Cancelled Treatment:    Reason Eval/Treat Not Completed: (P) Pain limiting ability to participate. Pt reports being in too much pain at this time and not being due for any more pain meds yet. Will plan to follow-up later as time permits.   Vernida Goodie, PT, DPT Acute Rehabilitation Services  Office: [phone]    Ellyn Hack 02/19/2024, 8:42 AM

## 2024-02-19 NOTE — Progress Notes (Addendum)
 Physical Therapy Treatment Patient Details Name: Brian Murray. MRN: [id number] DOB: 21-Sep-1951 Today's Date: 02/19/2024   History of Present Illness Pt is a 73 y/o male who presented for C5-C6 corpectomy instrumentation fusion from C4-C7 4/28 and posterior cervical instrumentation fusion C2-T1 4/30. PMH: Chronic low back pain, HTN, rocky mountain spotted fever, previous posterior interspinous wiring and methylmethacrylate fusion d/t to neck fracture in 1977.    PT Comments  The pt reports increased pain this date, but was able to participate in hallway ambulation after being premedicated. He continues to demonstrate gait deviations that impact his stability. Cues provided to increase his R foot clearance to reduce his risk of tripping, but only momentary success noted. Educated pt to be cautious of stepping over obstacles and ensure he clears his foot for his safety. He verbalized understanding. Encouraged pt to just mobilize to tolerance at this time. Will continue to follow acutely.     If plan is discharge home, recommend the following: A little help with walking and/or transfers;A little help with bathing/dressing/bathroom;Assistance with cooking/housework;Assist for transportation;Help with stairs or ramp for entrance   Can travel by private vehicle        Equipment Recommendations  Rolling walker (2 wheels)    Recommendations for Other Services       Precautions / Restrictions Precautions Precautions: Cervical;Fall Precaution Booklet Issued: Yes (comment) Recall of Precautions/Restrictions: Intact Precaution/Restrictions Comments: reviewed precautions Required Braces or Orthoses: Cervical Brace Cervical Brace: Hard collar Restrictions Weight Bearing Restrictions Per Provider Order: No Other Position/Activity Restrictions: 4/30 order states brace can be removed in bed, ambulate to b.room without, and apply in sitting. 5/1 surgery notes state "limiting his neck motions and  faithfully wearing his collar to allow his fusions to heal" secure chat on 5/2 for clarification, awaiting response     Mobility  Bed Mobility Overal bed mobility: Modified Independent             General bed mobility comments: Pt able to roll and transition sidelying <> sit R EOB with HOB elevated without assistance    Transfers Overall transfer level: Needs assistance Equipment used: None Transfers: Sit to/from Stand Sit to Stand: Contact guard assist           General transfer comment: Slow to power up to stand and cautious, no LOB, CGA for safety    Ambulation/Gait Ambulation/Gait assistance: Contact guard assist Gait Distance (Feet): 300 Feet Assistive device: None Gait Pattern/deviations: Step-through pattern, Narrow base of support, Drifts right/left, Decreased dorsiflexion - right Gait velocity: reduced Gait velocity interpretation: 1.31 - 2.62 ft/sec, indicative of limited community ambulator   General Gait Details: Pt ambulates with cautious L initial contact, reportedly baseline due to hx of L heel crush injury without repair years ago. Noted decreased R dorsiflexion and foot clearance as well. Moments of narrow BOS with mild lateral swaying/drifting, but no LOB, CGA for safety. Cues provided to improve R foot clearance with momentary success noted   Stairs             Wheelchair Mobility     Tilt Bed    Modified Rankin (Stroke Patients Only)       Balance Overall balance assessment: Mild deficits observed, not formally tested                                          Communication Communication Communication:  No apparent difficulties  Cognition Arousal: Alert Behavior During Therapy: WFL for tasks assessed/performed   PT - Cognitive impairments: Memory                       PT - Cognition Comments: can be repetitive in comments at times Following commands: Intact      Cueing Cueing Techniques: Verbal cues   Exercises      General Comments General comments (skin integrity, edema, etc.): educated pt to monitor his symptoms and just do activity to tolerance      Pertinent Vitals/Pain Pain Assessment Pain Assessment: Faces Faces Pain Scale: Hurts even more Pain Location: neck Pain Descriptors / Indicators: Sore, Discomfort, Operative site guarding Pain Intervention(s): Limited activity within patient's tolerance, Monitored during session, Repositioned, Premedicated before session    Home Living                          Prior Function            PT Goals (current goals can now be found in the care plan section) Acute Rehab PT Goals Patient Stated Goal: to not fall or need another surgery PT Goal Formulation: With patient Time For Goal Achievement: 03/03/24 Potential to Achieve Goals: Good Progress towards PT goals: Progressing toward goals    Frequency    Min 5X/week      PT Plan      Co-evaluation              AM-PAC PT "6 Clicks" Mobility   Outcome Measure  Help needed turning from your back to your side while in a flat bed without using bedrails?: None Help needed moving from lying on your back to sitting on the side of a flat bed without using bedrails?: None Help needed moving to and from a bed to a chair (including a wheelchair)?: A Little Help needed standing up from a chair using your arms (e.g., wheelchair or bedside chair)?: A Little Help needed to walk in hospital room?: A Little Help needed climbing 3-5 steps with a railing? : A Little 6 Click Score: 20    End of Session Equipment Utilized During Treatment: Gait belt;Cervical collar Activity Tolerance: Patient tolerated treatment well Patient left: with call bell/phone within reach;in bed;with bed alarm set Nurse Communication: Mobility status PT Visit Diagnosis: Unsteadiness on feet (R26.81);Other abnormalities of gait and mobility (R26.89);Other symptoms and signs involving the nervous  system (R29.898)     Time: 1610-9604 PT Time Calculation (min) (ACUTE ONLY): 13 min  Charges:    $Gait Training: 8-22 mins PT General Charges $$ ACUTE PT VISIT: 1 Visit                     Vernida Goodie, PT, DPT Acute Rehabilitation Services  Office: [phone]    Ellyn Hack 02/19/2024, 4:31 PM

## 2024-02-19 NOTE — Progress Notes (Signed)
 No new issues or problems.  Patient with significant posterior cervical pain but no radiating pain numbness or new weakness.  Wound dressing is clean and dry.  Neck soft.  Airway stable.    Awake and alert.  Oriented and appropriate.  Motor and sensory function stable.  Progressing slowly following anterior posterior cervical fusion.  Continue efforts at pain control.  Add Toradol for better pain relief.  Continue efforts at mobilization.

## 2024-02-20 MED ORDER — OXYCODONE HCL 5 MG PO TABS
10.0000 mg | ORAL_TABLET | ORAL | Status: DC | PRN
  Administered 2024-02-20 – 2024-02-22 (×16): 10 mg via ORAL
  Filled 2024-02-20 (×16): qty 2

## 2024-02-20 NOTE — Progress Notes (Signed)
 Physical Therapy Treatment Patient Details Name: Brian Murray. MRN: [id number] DOB: [birth year] Today's Date: 02/20/2024   History of Present Illness Pt is a 73 y/o male who presented for C5-C6 corpectomy instrumentation fusion from C4-C7 4/28 and posterior cervical instrumentation fusion C2-T1 4/30. PMH: Chronic low back pain, HTN, rocky mountain spotted fever, previous posterior interspinous wiring and methylmethacrylate fusion d/t to neck fracture in 1977.    PT Comments  The pt continues to report high pain levels, even with being premedicated and receiving pain meds during the start of the session. He remains motivated to participate and improve though. While encouraging pt to only do what he can tolerate, he did desire to try to ambulate further today and work on improving his balance. He displayed increased instability as he fatigued, resulting in intermittent LOB bouts in which pt needed minA to recover. Educated pt to use a RW at d/c or have someone hold onto him to assist/guard him whenever he is standing/ambulating without an AD due to his risk for falls. He verbalized understanding. Practiced taking side steps and anterior tandem steps to try to gradually challenge his balance. Pt needed min-modA to prevent LOB when performing tandem steps. Will continue to follow acutely.     If plan is discharge home, recommend the following: A little help with walking and/or transfers;A little help with bathing/dressing/bathroom;Assistance with cooking/housework;Assist for transportation;Help with stairs or ramp for entrance   Can travel by private vehicle        Equipment Recommendations  Rolling walker (2 wheels)    Recommendations for Other Services       Precautions / Restrictions Precautions Precautions: Cervical;Fall Precaution Booklet Issued: Yes (comment) Recall of Precautions/Restrictions: Intact Precaution/Restrictions Comments: reviewed precautions Required Braces or  Orthoses: Cervical Brace Cervical Brace: Hard collar Restrictions Weight Bearing Restrictions Per Provider Order: No Other Position/Activity Restrictions: 4/30 order states brace can be removed in bed, ambulate to b.room without, and apply in sitting. 5/1 surgery notes state "limiting his neck motions and faithfully wearing his collar to allow his fusions to heal" secure chat on 5/2 for clarification, awaiting response     Mobility  Bed Mobility               General bed mobility comments: Pt sitting up in chair upon arrival and at end of session    Transfers Overall transfer level: Needs assistance Equipment used: None Transfers: Sit to/from Stand Sit to Stand: Supervision           General transfer comment: Slow to power up to stand and cautious, no LOB, supervision for safety    Ambulation/Gait Ambulation/Gait assistance: Contact guard assist, Min assist Gait Distance (Feet): 460 Feet Assistive device: None Gait Pattern/deviations: Step-through pattern, Narrow base of support, Drifts right/left, Decreased dorsiflexion - right Gait velocity: reduced Gait velocity interpretation: >2.62 ft/sec, indicative of community ambulatory   General Gait Details: Pt wore his tennis shoes today. Continued to note decreased R dorsiflexion and foot clearance to the point of toe drag, needing cues to correct and obtain a heel strike pattern. Moments of narrow BOS (to the point of tandem steps) with lateral swaying/drifting and intermittent lateral LOB, minA to recover, otherwise CGA for safety. Encouraged pt to only mobilize to tolerance, pt wanting to go further today.   Stairs             Wheelchair Mobility     Tilt Bed    Modified Rankin (Stroke Patients Only)  Balance Overall balance assessment: Needs assistance Sitting-balance support: No upper extremity supported, Feet supported Sitting balance-Leahy Scale: Good     Standing balance support: No upper  extremity supported, During functional activity Standing balance-Leahy Scale: Fair Standing balance comment: intermittent LOB when ambulating without UE support, needing minA to recover             High level balance activites: Side stepping, Other (comment) (tandem stepping anteriorly)              Communication Communication Communication: No apparent difficulties  Cognition Arousal: Alert Behavior During Therapy: WFL for tasks assessed/performed   PT - Cognitive impairments: No apparent impairments                         Following commands: Intact      Cueing Cueing Techniques: Verbal cues  Exercises Other Exercises Other Exercises: performed the following balance exercises without UE support: tandem anterior walking > x10 steps with min-modA for balance, side stepping x10 steps bil with CGA-minA for balance    General Comments General comments (skin integrity, edema, etc.): continued to encourage activity to tolerance only and sitting up for no longer than 45-60 min at a time      Pertinent Vitals/Pain Pain Assessment Pain Assessment: Faces Faces Pain Scale: Hurts even more Pain Location: neck Pain Descriptors / Indicators: Sore, Discomfort, Operative site guarding Pain Intervention(s): Limited activity within patient's tolerance, Monitored during session, Repositioned, Premedicated before session, RN gave pain meds during session    Home Living                          Prior Function            PT Goals (current goals can now be found in the care plan section) Acute Rehab PT Goals Patient Stated Goal: to not fall or need another surgery PT Goal Formulation: With patient Time For Goal Achievement: 03/03/24 Potential to Achieve Goals: Good Progress towards PT goals: Progressing toward goals    Frequency    Min 5X/week      PT Plan      Co-evaluation              AM-PAC PT "6 Clicks" Mobility   Outcome Measure   Help needed turning from your back to your side while in a flat bed without using bedrails?: None Help needed moving from lying on your back to sitting on the side of a flat bed without using bedrails?: None Help needed moving to and from a bed to a chair (including a wheelchair)?: A Little Help needed standing up from a chair using your arms (e.g., wheelchair or bedside chair)?: A Little Help needed to walk in hospital room?: A Little Help needed climbing 3-5 steps with a railing? : A Little 6 Click Score: 20    End of Session Equipment Utilized During Treatment: Gait belt;Cervical collar Activity Tolerance: Patient tolerated treatment well Patient left: with call bell/phone within reach;in chair;with family/visitor present Nurse Communication: Mobility status;Other (comment) (no chair alarm, RN ok with this considering wife is present) PT Visit Diagnosis: Unsteadiness on feet (R26.81);Other abnormalities of gait and mobility (R26.89);Other symptoms and signs involving the nervous system (R29.898)     Time: 1610-9604 PT Time Calculation (min) (ACUTE ONLY): 16 min  Charges:    $Gait Training: 8-22 mins PT General Charges $$ ACUTE PT VISIT: 1 Visit  Vernida Goodie, PT, DPT Acute Rehabilitation Services  Office: [phone]    Ellyn Hack 02/20/2024, 3:49 PM

## 2024-02-20 NOTE — Progress Notes (Signed)
 New issues or problems.  Patient still with intense posterior cervical pain with standing or walking.  No radiating pain numbness weakness.  No wound issues.  Patient does not feel ready for discharge home.  Status post anterior posterior cervical decompression and fusion.  Continue efforts at pain control and mobilization.

## 2024-02-21 ENCOUNTER — Encounter (HOSPITAL_COMMUNITY): Payer: Self-pay | Admitting: Neurosurgery

## 2024-02-21 NOTE — Progress Notes (Signed)
 Physical Therapy Treatment Patient Details Name: Brian Murray. MRN: [id number] DOB: May 30, 1951 Today's Date: 02/21/2024   History of Present Illness Pt is a 73 y/o male who presented for C5-C6 corpectomy instrumentation fusion from C4-C7 4/28 and posterior cervical instrumentation fusion C2-T1 4/30. PMH: Chronic low back pain, HTN, rocky mountain spotted fever, previous posterior interspinous wiring and methylmethacrylate fusion d/t to neck fracture in 1977.    PT Comments  The pt was agreeable to session despite reports of 8/10 pain at rest. He continues to demo decreased R dorsiflexion and limited heel strike during gait, especially with fatigue. Pt encouraged to complete step-ups with each leg, demos decreased power and stability, especially in RLE and increased challenge with positioning of LE on stair without cues. Pt educated on log roll and importance of maintaining cervical precautions with mobility.     If plan is discharge home, recommend the following: A little help with walking and/or transfers;A little help with bathing/dressing/bathroom;Assistance with cooking/housework;Assist for transportation;Help with stairs or ramp for entrance   Can travel by private vehicle        Equipment Recommendations  Rolling walker (2 wheels)    Recommendations for Other Services       Precautions / Restrictions Precautions Precautions: Cervical;Fall Precaution Booklet Issued: Yes (comment) Recall of Precautions/Restrictions: Intact Precaution/Restrictions Comments: reviewed precautions, poor adherence with mobility Required Braces or Orthoses: Cervical Brace Cervical Brace: Hard collar Restrictions Weight Bearing Restrictions Per Provider Order: No Other Position/Activity Restrictions: 4/30 order states brace can be removed in bed, ambulate to b.room without, and apply in sitting. 5/1 surgery notes state "limiting his neck motions and faithfully wearing his collar to allow his fusions  to heal" secure chat on 5/2 for clarification, awaiting response     Mobility  Bed Mobility Overal bed mobility: Needs Assistance Bed Mobility: Rolling, Sidelying to Sit, Sit to Sidelying Rolling: Min assist Sidelying to sit: Min assist, HOB elevated, Used rails     Sit to sidelying: Min assist, HOB elevated General bed mobility comments: cues and assist to complete with log roll    Transfers Overall transfer level: Needs assistance Equipment used: None Transfers: Sit to/from Stand Sit to Stand: Supervision           General transfer comment: Slow to power up to stand and cautious, no LOB, supervision for safety    Ambulation/Gait Ambulation/Gait assistance: Contact guard assist, Min assist Gait Distance (Feet): 400 Feet Assistive device: None Gait Pattern/deviations: Step-through pattern, Narrow base of support, Drifts right/left, Decreased dorsiflexion - right Gait velocity: reduced Gait velocity interpretation: <1.31 ft/sec, indicative of household ambulator   General Gait Details: Pt wore his tennis shoes today. Continued to note decreased R dorsiflexion and foot clearance to the point of toe drag, especially with fatiuge. Pt able to correct but not maintain   Stairs Stairs: Yes Stairs assistance: Contact guard assist Stair Management: One rail Right, One rail Left, Alternating pattern, Forwards Number of Stairs: 5 (x2) General stair comments: x5 step ups each foot for balance and strengthening. discussed use of feel rather than vision for foot placement      Balance Overall balance assessment: Needs assistance Sitting-balance support: No upper extremity supported, Feet supported Sitting balance-Leahy Scale: Good     Standing balance support: No upper extremity supported, During functional activity Standing balance-Leahy Scale: Fair Standing balance comment: intermittent LOB when ambulating without UE support, needing minA to recover  Communication Communication Communication: No apparent difficulties  Cognition Arousal: Alert Behavior During Therapy: WFL for tasks assessed/performed   PT - Cognitive impairments: No apparent impairments                       PT - Cognition Comments: able to follow instructions, poor adherence to cervical precautions with movement Following commands: Intact      Cueing Cueing Techniques: Verbal cues         Pertinent Vitals/Pain Pain Assessment Pain Assessment: 0-10 Pain Score: 8  Faces Pain Scale: Hurts little more Pain Location: neck Pain Descriptors / Indicators: Sore, Discomfort, Operative site guarding Pain Intervention(s): Limited activity within patient's tolerance, Monitored during session, Repositioned, Patient requesting pain meds-RN notified     PT Goals (current goals can now be found in the care plan section) Acute Rehab PT Goals Patient Stated Goal: to not fall or need another surgery PT Goal Formulation: With patient Time For Goal Achievement: 03/03/24 Potential to Achieve Goals: Good Progress towards PT goals: Progressing toward goals    Frequency    Min 5X/week       AM-PAC PT "6 Clicks" Mobility   Outcome Measure  Help needed turning from your back to your side while in a flat bed without using bedrails?: None Help needed moving from lying on your back to sitting on the side of a flat bed without using bedrails?: None Help needed moving to and from a bed to a chair (including a wheelchair)?: A Little Help needed standing up from a chair using your arms (e.g., wheelchair or bedside chair)?: A Little Help needed to walk in hospital room?: A Little Help needed climbing 3-5 steps with a railing? : A Little 6 Click Score: 20    End of Session Equipment Utilized During Treatment: Gait belt;Cervical collar Activity Tolerance: Patient tolerated treatment well Patient left: with call bell/phone within reach;with  family/visitor present;in bed Nurse Communication: Mobility status;Patient requests pain meds PT Visit Diagnosis: Unsteadiness on feet (R26.81);Other abnormalities of gait and mobility (R26.89);Other symptoms and signs involving the nervous system (R29.898)     Time: 4098-1191 PT Time Calculation (min) (ACUTE ONLY): 26 min  Charges:    $Gait Training: 8-22 mins $Therapeutic Exercise: 8-22 mins PT General Charges $$ ACUTE PT VISIT: 1 Visit                     Barnabas Booth, PT, DPT   Acute Rehabilitation Department Office [phone] Secure Chat Communication Preferred   Lona Rist 02/21/2024, 4:10 PM

## 2024-02-21 NOTE — Progress Notes (Signed)
 Subjective: The patient is alert and pleasant.  His wife is at the bedside.  He said he had a bad night because he skipped one of his doses of oxycodone  requiring IV morphine  and Toradol.  Objective: Vital signs in last 24 hours: Temp:  [98.1 F (36.7 C)-98.4 F (36.9 C)] 98.1 F (36.7 C) (05/05 0758) Pulse Rate:  [85-96] 85 (05/05 0758) Resp:  [16-18] 18 (05/05 0758) BP: (102-112)/(64-78) 104/75 (05/05 0758) SpO2:  [95 %-100 %] 97 % (05/05 0758) Estimated body mass index is 22.96 kg/m as calculated from the following:   Height as of this encounter: 5\' 10"  (1.778 m).   Weight as of this encounter: 72.6 kg.   Intake/Output from previous day: No intake/output data recorded. Intake/Output this shift: No intake/output data recorded.  Physical exam the patient is alert and oriented.  He is moving all 4 extremities well.  His anterior and posterior cervical incisions are healing well.  I remove the dressings.  Lab Results: No results for input(s): "WBC", "HGB", "HCT", "PLT" in the last 72 hours. BMET No results for input(s): "NA", "K", "CL", "CO2", "GLUCOSE", "BUN", "CREATININE", "CALCIUM" in the last 72 hours.  Studies/Results: No results found.  Assessment/Plan: Postop day #7/5: Pain management has been an issue.  I think this is secondary to both anterior and posterior surgeries, his fracture, and chronic pain medication use.  I told him it is time to stop the IV medicine and get used to taking oral pain medications.  I suggest that he take it to 3 hours as needed as prescribed and we will see how he does tonight and hopefully be able to discharge him tomorrow.  Have answered all their questions.  LOS: 7 days     Brian Murray 02/21/2024, 8:05 AM     Patient ID: Brian Murray., male   DOB: 16-Apr-1951, 73 y.o.   MRN: [id number]

## 2024-02-22 MED ORDER — CYCLOBENZAPRINE HCL 5 MG PO TABS: 5.0000 mg | ORAL_TABLET | Freq: Three times a day (TID) | ORAL | 0 refills | Status: DC | PRN

## 2024-02-22 MED ORDER — DOCUSATE SODIUM 100 MG PO CAPS: 100.0000 mg | ORAL_CAPSULE | Freq: Two times a day (BID) | ORAL | 0 refills | Status: DC

## 2024-02-22 MED ORDER — OXYCODONE-ACETAMINOPHEN 10-325 MG PO TABS: 1.0000 | ORAL_TABLET | ORAL | 0 refills | Status: DC | PRN

## 2024-02-22 NOTE — Discharge Summary (Signed)
 Physician Discharge Summary  Patient ID: Brian Murray. MRN: [id number] DOB/AGE: Apr 19, 1951 73 y.o.  Admit date: 02/14/2024 Discharge date: 02/22/2024  Admission Diagnoses: Cervical fracture subluxation, cervicalgia Discharge Diagnoses: The same Principal Problem:   C6 cervical fracture [facility])   Discharged Condition: fair  Hospital Course: I performed a C5 and C6 corpectomy instrumentation and fusion on the patient on 02/14/2024.  I performed a second stage posterior cervical instrumentation and fusion from C2-T1 on the patient on 02/16/2024.  The patient's postoperative course was complicated by difficulty with pain control.  The patient has had neck fracture, has been on chronic pain medications, has had both anterior and posterior cervical surgeries.  His pain is slowly improved with increasing doses of oxycodone .  On 02/22/2024 the patient felt he was ready to be discharged to home.  He was given verbal and written discharge instructions.  He was instructed to ambulate but otherwise limit his activities, wear his collar at all times, etc.  He was instructed to follow-up in 2 weeks in the office for follow-up x-rays.  All his questions were answered.  Consults: PT, care management, rehab Significant Diagnostic Studies: None Treatments: C5 and C6 corpectomy with anterior instrumentation C4-C7 on 02/14/2024 and second stage C2-T1 posterior instrumentation fusion on 02/16/2024 Discharge Exam: Blood pressure 95/62, pulse 86, temperature 98.2 F (36.8 C), temperature source Oral, resp. rate 18, height 5\' 10"  (1.778 m), weight 72.6 kg, SpO2 97%. The patient is alert and pleasant.  His wounds are healing well.  He is moving all 4 extremities well.  Disposition: Home   Allergies as of 02/22/2024       Reactions   Other Other (See Comments)   Kratom- Elevated LFT and Hallucinations        Medication List     STOP taking these medications    acetaminophen  500 MG tablet Commonly known  as: TYLENOL    aspirin-acetaminophen -caffeine 250-250-65 MG tablet Commonly known as: EXCEDRIN MIGRAINE   Diclofenac  Sodium CR 100 MG 24 hr tablet   ibuprofen 200 MG tablet Commonly known as: ADVIL   predniSONE  1 MG tablet Commonly known as: DELTASONE    silodosin  8 MG Caps capsule Commonly known as: RAPAFLO        TAKE these medications    benazepril  40 MG tablet Commonly known as: LOTENSIN  Take 1 tablet (40 mg total) by mouth daily.   cyclobenzaprine  5 MG tablet Commonly known as: FLEXERIL  Take 1 tablet (5 mg total) by mouth 3 (three) times daily as needed for muscle spasms.   docusate sodium  100 MG capsule Commonly known as: COLACE Take 1 capsule (100 mg total) by mouth 2 (two) times daily.   lovastatin  40 MG tablet Commonly known as: MEVACOR  Take 1 tablet (40 mg total) by mouth at bedtime.   mirtazapine  15 MG tablet Commonly known as: REMERON  Take 1 tablet (15 mg total) by mouth at bedtime.   oxyCODONE -acetaminophen  10-325 MG tablet Commonly known as: PERCOCET Take 1 tablet by mouth every 3 (three) hours as needed for pain. What changed:  when to take this reasons to take this   pantoprazole  40 MG tablet Commonly known as: PROTONIX  Take 1 tablet (40 mg total) by mouth daily.   pregabalin  150 MG capsule Commonly known as: Lyrica  Take 1 capsule (150 mg total) by mouth 2 (two) times daily.         Signed: [name] 02/22/2024, 7:50 AM

## 2024-02-22 NOTE — TOC Transition Note (Signed)
 Transition of Care (TOC) - Discharge Note Sherin Dingwall RN,BSN Transitions of Care Unit 4NP (Non Trauma)- RN Case Manager See Treatment Team for direct Phone #   Patient Details  Name: Brian Murray. MRN: [id number] Date of Birth: 03/16/1951  Transition of Care [facility]) CM/SW Contact:  Rox Cope, RN Phone Number: 02/22/2024, 4:27 PM   Clinical Narrative:    Pt stable for transition home today, notified by unit RN that pt needs RW for home, DME order to be placed (MD will need to co-sign before DME agency will deliver).   Contacted in-house provider- [employer] for DME need- RW to be delivered once co-signed.   Family to transport home, no further TOC needs noted.    Final next level of care: Home/Self Care Barriers to Discharge: No Barriers Identified   Patient Goals and CMS Choice Patient states their goals for this hospitalization and ongoing recovery are:: return home   Choice offered to / list presented to : Patient      Discharge Placement               Home        Discharge Plan and Services Additional resources added to the After Visit Summary for     Discharge Planning Services: CM Consult Post Acute Care Choice: Durable Medical Equipment          DME Arranged: Otho Blitz rolling DME Agency: [employer] Date DME Agency Contacted: 02/22/24 Time DME Agency Contacted: 1630 Representative spoke with at DME Agency: [facility] HH Arranged: NA HH Agency: NA        Social Drivers of Health (SDOH) Interventions SDOH Screenings   Food Insecurity: No Food Insecurity (02/16/2024)  Housing: Unknown (02/16/2024)  Transportation Needs: No Transportation Needs (02/16/2024)  Utilities: Not At Risk (02/16/2024)  Alcohol Screen: Low Risk  (06/17/2022)  Depression (PHQ2-9): High Risk (05/14/2023)  Financial Resource Strain: Low Risk  (06/17/2022)  Physical Activity: Sufficiently Active (06/17/2022)  Social Connections: Moderately Isolated (02/16/2024)  Stress: No Stress  Concern Present (06/17/2022)  Tobacco Use: Low Risk  (02/14/2024)     Readmission Risk Interventions    02/22/2024    4:27 PM  Readmission Risk Prevention Plan  Post Dischage Appt Complete  Medication Screening Complete  Transportation Screening Complete

## 2024-02-22 NOTE — Plan of Care (Signed)
  Problem: Education: Goal: Knowledge of General Education information will improve Description: Including pain rating scale, medication(s)/side effects and non-pharmacologic comfort measures Outcome: Not Progressing  Patient continue to c/o pain all the time.

## 2024-02-22 NOTE — Progress Notes (Signed)
 Occupational Therapy Treatment Patient Details Name: Brian Murray. MRN: [id number] DOB: 1951/03/22 Today's Date: 02/22/2024   History of present illness Pt is a 73 y/o male who presented for C5-C6 corpectomy instrumentation fusion from C4-C7 4/28 and posterior cervical instrumentation fusion C2-T1 4/30. PMH: Chronic low back pain, HTN, rocky mountain spotted fever, previous posterior interspinous wiring and methylmethacrylate fusion d/t to neck fracture in 1977.   OT comments  Pt exiting bathroom upon entry.  Educated on recommendations to wear glasses to assist with diplopia during all mobility (as well as having close hands on when mobilizing supervision) for fall prevention, also discussed further fall prevention strategies (lighting, footwear, clear pathways, etc) and follow up with neurosurgeon and/or ophthalmologist about vision.  He requires min assist for LB dressing, spouse able to assist at home.  Requires review of cervical precautions and ADL compensatory techniques, only recalls 1/3 precautions without cueing. Spouse will be able to assist as needed.  Will follow acutely.       If plan is discharge home, recommend the following:  Assistance with cooking/housework;Assist for transportation;A little help with bathing/dressing/bathroom;Help with stairs or ramp for entrance   Equipment Recommendations  Other (comment) (RW)    Recommendations for Other Services      Precautions / Restrictions Precautions Precautions: Cervical;Fall Precaution Booklet Issued: Yes (comment) Recall of Precautions/Restrictions: Impaired Precaution/Restrictions Comments: reviewed precautions, pt only recalling 1/3; requires cueing to adhere functionally Required Braces or Orthoses: Cervical Brace Cervical Brace: Hard collar;At all times Restrictions Weight Bearing Restrictions Per Provider Order: No Other Position/Activity Restrictions: dc summary states "wear collar at all times"        Mobility Bed Mobility                    Transfers Overall transfer level: Needs assistance Equipment used: None Transfers: Sit to/from Stand Sit to Stand: Supervision                 Balance Overall balance assessment: Needs assistance Sitting-balance support: No upper extremity supported, Feet supported Sitting balance-Leahy Scale: Good     Standing balance support: No upper extremity supported, During functional activity Standing balance-Leahy Scale: Fair                             ADL either performed or assessed with clinical judgement   ADL Overall ADL's : Needs assistance/impaired                     Lower Body Dressing: Minimal assistance;Sit to/from stand Lower Body Dressing Details (indicate cue type and reason): pt require asssist to pull on shoes, cueing to adhere to cervical precautions Toilet Transfer: Supervision/safety;Ambulation Toilet Transfer Details (indicate cue type and reason): pt exiting bathroom upon entry.         Functional mobility during ADLs: Supervision/safety      Extremity/Trunk Assessment              Vision   Additional Comments: per chart, neurosurgery is aware of diplopia and believe it is due to medication.  Pt continues to have diplopia this date.  Educated on wearing "eye patched" glasses during mobility to optimize safety, esp due to depth perception to prevent falls. Also discussed speaking to NS if medication changes to do not improve vision, and following up with opthalmologist.   Perception     Praxis     Communication Communication Communication: No apparent difficulties  Cognition Arousal: Alert Behavior During Therapy: WFL for tasks assessed/performed Cognition: No apparent impairments, Cognition impaired       Memory impairment (select all impairments): Short-term memory, Working memory     OT - Cognition Comments: pt requires cueing to recall precautions and ahere  functionally.  Pt reports likely pain medication related affecting memory.                 Following commands: Intact        Cueing   Cueing Techniques: Verbal cues  Exercises      Shoulder Instructions       General Comments adjusted collar at EOB, reached out to RN about additional pads for collar.  Pt education on positions for sleeping/resting, as reports sidelying is more comfortable.  Encouarged alignment and support with pillows, as well as ensuring he is wearing his cervical collar. Reviewed fall prevention techniques to optimize safety upon return home.    Pertinent Vitals/ Pain       Pain Assessment Pain Assessment: 0-10 Pain Score: 9  Faces Pain Scale: Hurts little more Pain Location: neck Pain Descriptors / Indicators: Sore, Discomfort, Operative site guarding Pain Intervention(s): Limited activity within patient's tolerance, Monitored during session, Repositioned  Home Living                                          Prior Functioning/Environment              Frequency  Min 2X/week        Progress Toward Goals  OT Goals(current goals can now be found in the care plan section)  Progress towards OT goals: Progressing toward goals  Acute Rehab OT Goals Patient Stated Goal: home OT Goal Formulation: With patient Time For Goal Achievement: 03/03/24 Potential to Achieve Goals: Good  Plan      Co-evaluation                 AM-PAC OT "6 Clicks" Daily Activity     Outcome Measure   Help from another person eating meals?: None Help from another person taking care of personal grooming?: A Little Help from another person toileting, which includes using toliet, bedpan, or urinal?: A Little Help from another person bathing (including washing, rinsing, drying)?: A Little Help from another person to put on and taking off regular upper body clothing?: A Little Help from another person to put on and taking off regular lower  body clothing?: A Little 6 Click Score: 19    End of Session Equipment Utilized During Treatment: Cervical collar  OT Visit Diagnosis: Pain;Unsteadiness on feet (R26.81);Other abnormalities of gait and mobility (R26.89);History of falling (Z91.81);Muscle weakness (generalized) (M62.81) Pain - Right/Left: Left Pain - part of body: Arm   Activity Tolerance Patient tolerated treatment well   Patient Left with call bell/phone within reach;with family/visitor present;Other (comment) (EOB)   Nurse Communication Mobility status;Other (comment) (cervical pads?)        Time: 5427-0623 OT Time Calculation (min): 26 min  Charges: OT General Charges $OT Visit: 1 Visit OT Treatments $Self Care/Home Management : 23-37 mins  Bary Boss, OT Acute Rehabilitation Services Office [phone] Secure Chat Preferred    Fredrich Jefferson 02/22/2024, 12:45 PM

## 2024-02-22 NOTE — Progress Notes (Signed)
 Discharge instruction reviewed with Brian Murray and his wife. No other questions or concerns voiced at this time. Patient is waiting for rolling walker prior to discharge.

## 2024-03-14 DIAGNOSIS — S12500G Unspecified displaced fracture of sixth cervical vertebra, subsequent encounter for fracture with delayed healing: Secondary | ICD-10-CM | POA: Diagnosis not present

## 2024-04-13 ENCOUNTER — Ambulatory Visit (INDEPENDENT_AMBULATORY_CARE_PROVIDER_SITE_OTHER): Admitting: Family Medicine

## 2024-04-13 ENCOUNTER — Encounter: Payer: Self-pay | Admitting: Family Medicine

## 2024-04-13 VITALS — BP 126/80 | HR 84 | Temp 98.2°F | Wt 167.4 lb

## 2024-04-13 DIAGNOSIS — G8929 Other chronic pain: Secondary | ICD-10-CM

## 2024-04-13 DIAGNOSIS — S12500K Unspecified displaced fracture of sixth cervical vertebra, subsequent encounter for fracture with nonunion: Secondary | ICD-10-CM

## 2024-04-13 DIAGNOSIS — I77810 Thoracic aortic ectasia: Secondary | ICD-10-CM

## 2024-04-13 DIAGNOSIS — M542 Cervicalgia: Secondary | ICD-10-CM

## 2024-04-13 DIAGNOSIS — E782 Mixed hyperlipidemia: Secondary | ICD-10-CM

## 2024-04-13 DIAGNOSIS — M4842XS Fatigue fracture of vertebra, cervical region, sequela of fracture: Secondary | ICD-10-CM | POA: Diagnosis not present

## 2024-04-13 DIAGNOSIS — M545 Low back pain, unspecified: Secondary | ICD-10-CM

## 2024-04-13 DIAGNOSIS — M5010 Cervical disc disorder with radiculopathy, unspecified cervical region: Secondary | ICD-10-CM | POA: Diagnosis not present

## 2024-04-13 DIAGNOSIS — G8928 Other chronic postprocedural pain: Secondary | ICD-10-CM

## 2024-04-13 DIAGNOSIS — Z79899 Other long term (current) drug therapy: Secondary | ICD-10-CM

## 2024-04-13 DIAGNOSIS — F1190 Opioid use, unspecified, uncomplicated: Secondary | ICD-10-CM

## 2024-04-13 DIAGNOSIS — M8589 Other specified disorders of bone density and structure, multiple sites: Secondary | ICD-10-CM | POA: Diagnosis not present

## 2024-04-13 DIAGNOSIS — I10 Essential (primary) hypertension: Secondary | ICD-10-CM

## 2024-04-13 DIAGNOSIS — F5101 Primary insomnia: Secondary | ICD-10-CM

## 2024-04-13 DIAGNOSIS — I2510 Atherosclerotic heart disease of native coronary artery without angina pectoris: Secondary | ICD-10-CM

## 2024-04-13 MED ORDER — MIRTAZAPINE 15 MG PO TABS: 15.0000 mg | ORAL_TABLET | Freq: Every day | ORAL | 1 refills | Status: DC

## 2024-04-13 MED ORDER — CYCLOBENZAPRINE HCL 5 MG PO TABS: 5.0000 mg | ORAL_TABLET | Freq: Three times a day (TID) | ORAL | 1 refills | Status: DC | PRN

## 2024-04-13 MED ORDER — BENAZEPRIL HCL 40 MG PO TABS: 40.0000 mg | ORAL_TABLET | Freq: Every day | ORAL | 1 refills | Status: DC

## 2024-04-13 MED ORDER — LOVASTATIN 40 MG PO TABS: 40.0000 mg | ORAL_TABLET | Freq: Every day | ORAL | 3 refills | Status: AC

## 2024-04-13 MED ORDER — PANTOPRAZOLE SODIUM 40 MG PO TBEC: 40.0000 mg | DELAYED_RELEASE_TABLET | Freq: Every day | ORAL | 1 refills | Status: DC

## 2024-04-13 MED ORDER — PREGABALIN 150 MG PO CAPS: 150.0000 mg | ORAL_CAPSULE | Freq: Two times a day (BID) | ORAL | 1 refills | Status: DC

## 2024-04-13 MED ORDER — XTAMPZA ER 18 MG PO C12A: 18.0000 mg | EXTENDED_RELEASE_CAPSULE | Freq: Two times a day (BID) | ORAL | 0 refills | Status: DC
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
## 2024-04-13 NOTE — Patient Instructions (Addendum)
 Return in about 4 weeks (around 05/11/2024) for Routine chronic condition follow-up.        Great to see you today.  I have refilled the medication(s) we provide.   If labs were collected or images ordered, we will inform you of  results once we have received them and reviewed. We will contact you either by echart message, or telephone call.  Please give ample time to the testing facility, and our office to run,  receive and review results. Please do not call inquiring of results, even if you can see them in your chart. We will contact you as soon as we are able. If it has been over 1 week since the test was completed, and you have not yet heard from us , then please call us .    - echart message- for normal results that have been seen by the patient already.   - telephone call: abnormal results or if patient has not viewed results in their echart.  If a referral to a specialist was entered for you, please call us  in 2 weeks if you have not heard from the specialist office to schedule.

## 2024-05-09 ENCOUNTER — Encounter: Payer: Self-pay | Admitting: Pharmacist

## 2024-05-09 NOTE — Progress Notes (Signed)
 Pharmacy Quality Measure Review  This patient is appearing on a report for being at risk of failing the adherence measure for cholesterol (statin) and hypertension (ACEi/ARB) medications this calendar year.   Medication: benazepril  40mg  Last fill date: 12/08/2023 for 90 day supply  Medication: lovastatin   Last fill date: 02/07/2024 for 90 day supply  Reviewed recent refill history. Patient filled benazepril  for 90 days supply on 04/13/2024 and filled lovastatin  for 90 day supply on 05/04/2024  Insurance report was not up to date. No action needed at this time.   Madelin Ray, PharmD Clinical Pharmacist [facility]  Population Health [phone]

## 2024-05-16 DIAGNOSIS — S12500G Unspecified displaced fracture of sixth cervical vertebra, subsequent encounter for fracture with delayed healing: Secondary | ICD-10-CM | POA: Diagnosis not present

## 2024-06-28 ENCOUNTER — Ambulatory Visit

## 2024-06-29 ENCOUNTER — Ambulatory Visit: Admitting: Family Medicine

## 2024-06-30 ENCOUNTER — Encounter: Payer: Self-pay | Admitting: Family Medicine

## 2024-06-30 ENCOUNTER — Ambulatory Visit: Admitting: Family Medicine

## 2024-06-30 ENCOUNTER — Ambulatory Visit: Payer: Self-pay | Admitting: Family Medicine

## 2024-06-30 VITALS — BP 128/72 | HR 87 | Temp 98.2°F | Wt 155.8 lb

## 2024-06-30 DIAGNOSIS — M1990 Unspecified osteoarthritis, unspecified site: Secondary | ICD-10-CM

## 2024-06-30 DIAGNOSIS — I10 Essential (primary) hypertension: Secondary | ICD-10-CM | POA: Diagnosis not present

## 2024-06-30 DIAGNOSIS — K219 Gastro-esophageal reflux disease without esophagitis: Secondary | ICD-10-CM

## 2024-06-30 DIAGNOSIS — E782 Mixed hyperlipidemia: Secondary | ICD-10-CM | POA: Diagnosis not present

## 2024-06-30 DIAGNOSIS — M8589 Other specified disorders of bone density and structure, multiple sites: Secondary | ICD-10-CM

## 2024-06-30 DIAGNOSIS — R7309 Other abnormal glucose: Secondary | ICD-10-CM | POA: Diagnosis not present

## 2024-06-30 DIAGNOSIS — M545 Low back pain, unspecified: Secondary | ICD-10-CM

## 2024-06-30 DIAGNOSIS — I77810 Thoracic aortic ectasia: Secondary | ICD-10-CM

## 2024-06-30 DIAGNOSIS — G8928 Other chronic postprocedural pain: Secondary | ICD-10-CM

## 2024-06-30 DIAGNOSIS — Z23 Encounter for immunization: Secondary | ICD-10-CM | POA: Diagnosis not present

## 2024-06-30 DIAGNOSIS — F5101 Primary insomnia: Secondary | ICD-10-CM

## 2024-06-30 DIAGNOSIS — F1190 Opioid use, unspecified, uncomplicated: Secondary | ICD-10-CM

## 2024-06-30 DIAGNOSIS — I2510 Atherosclerotic heart disease of native coronary artery without angina pectoris: Secondary | ICD-10-CM

## 2024-06-30 DIAGNOSIS — M5010 Cervical disc disorder with radiculopathy, unspecified cervical region: Secondary | ICD-10-CM

## 2024-06-30 DIAGNOSIS — S12500K Unspecified displaced fracture of sixth cervical vertebra, subsequent encounter for fracture with nonunion: Secondary | ICD-10-CM

## 2024-06-30 DIAGNOSIS — Z5181 Encounter for therapeutic drug level monitoring: Secondary | ICD-10-CM

## 2024-06-30 DIAGNOSIS — Z79899 Other long term (current) drug therapy: Secondary | ICD-10-CM

## 2024-06-30 DIAGNOSIS — G8929 Other chronic pain: Secondary | ICD-10-CM

## 2024-06-30 DIAGNOSIS — R931 Abnormal findings on diagnostic imaging of heart and coronary circulation: Secondary | ICD-10-CM

## 2024-06-30 DIAGNOSIS — M542 Cervicalgia: Secondary | ICD-10-CM

## 2024-06-30 LAB — COMPREHENSIVE METABOLIC PANEL WITH GFR
ALT: 21 U/L (ref 0–53)
AST: 24 U/L (ref 0–37)
Albumin: 4.6 g/dL (ref 3.5–5.2)
Alkaline Phosphatase: 107 U/L (ref 39–117)
BUN: 21 mg/dL (ref 6–23)
CO2: 28 meq/L (ref 19–32)
Calcium: 10.1 mg/dL (ref 8.4–10.5)
Chloride: 98 meq/L (ref 96–112)
Creatinine, Ser: 0.66 mg/dL (ref 0.40–1.50)
GFR: 94.89 mL/min (ref >60.00)
Glucose, Bld: 98 mg/dL (ref 70–99)
Potassium: 5.1 meq/L (ref 3.5–5.1)
Sodium: 135 meq/L (ref 135–145)
Total Bilirubin: 1 mg/dL (ref 0.2–1.2)
Total Protein: 7.3 g/dL (ref 6.0–8.3)

## 2024-06-30 LAB — CBC
HCT: 39.7 % (ref 39.0–52.0)
Hemoglobin: 13 g/dL (ref 13.0–17.0)
MCHC: 32.7 g/dL (ref 30.0–36.0)
MCV: 96 fl (ref 78.0–100.0)
Platelets: 244 K/uL (ref 150.0–400.0)
RBC: 4.14 Mil/uL — ABNORMAL LOW (ref 4.22–5.81)
RDW: 15.1 % (ref 11.5–15.5)
WBC: 11.4 K/uL — ABNORMAL HIGH (ref 4.0–10.5)

## 2024-06-30 LAB — LIPID PANEL
Cholesterol: 151 mg/dL (ref 0–200)
HDL: 61.5 mg/dL (ref >39.00)
LDL Cholesterol: 65 mg/dL (ref 0–99)
NonHDL: 89.27
Total CHOL/HDL Ratio: 2
Triglycerides: 120 mg/dL (ref 0.0–149.0)
VLDL: 24 mg/dL (ref 0.0–40.0)

## 2024-06-30 LAB — B12 AND FOLATE PANEL
Folate: 14.3 ng/mL (ref >5.9)
Vitamin B-12: 537 pg/mL (ref 211–911)

## 2024-06-30 LAB — VITAMIN D 25 HYDROXY (VIT D DEFICIENCY, FRACTURES): VITD: 33.69 ng/mL (ref 30.00–100.00)

## 2024-06-30 LAB — TSH: TSH: 0.76 u[IU]/mL (ref 0.35–5.50)

## 2024-06-30 LAB — HEMOGLOBIN A1C: Hgb A1c MFr Bld: 5.1 % (ref 4.6–6.5)

## 2024-06-30 LAB — MAGNESIUM: Magnesium: 2.2 mg/dL (ref 1.5–2.5)

## 2024-06-30 MED ORDER — MIRTAZAPINE 30 MG PO TABS: 30.0000 mg | ORAL_TABLET | Freq: Every day | ORAL | 1 refills | Status: DC

## 2024-06-30 MED ORDER — PANTOPRAZOLE SODIUM 40 MG PO TBEC: 40.0000 mg | DELAYED_RELEASE_TABLET | Freq: Every day | ORAL | 1 refills | Status: DC

## 2024-06-30 MED ORDER — XTAMPZA ER 18 MG PO C12A: 18.0000 mg | EXTENDED_RELEASE_CAPSULE | Freq: Two times a day (BID) | ORAL | 0 refills | Status: DC

## 2024-06-30 MED ORDER — PREGABALIN 150 MG PO CAPS: 150.0000 mg | ORAL_CAPSULE | Freq: Two times a day (BID) | ORAL | 1 refills | Status: DC

## 2024-06-30 MED ORDER — CYCLOBENZAPRINE HCL 5 MG PO TABS: 5.0000 mg | ORAL_TABLET | Freq: Three times a day (TID) | ORAL | 1 refills | Status: DC | PRN

## 2024-06-30 MED ORDER — BENAZEPRIL HCL 40 MG PO TABS: 40.0000 mg | ORAL_TABLET | Freq: Every day | ORAL | 1 refills | Status: DC
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
## 2024-06-30 NOTE — Patient Instructions (Addendum)
 Return in about 11 weeks (around 09/15/2024) for Routine chronic condition follow-up (40 min).        Great to see you today.  I have refilled the medication(s) we provide.   If labs were collected or images ordered, we will inform you of  results once we have received them and reviewed. We will contact you either by echart message, or telephone call.  Please give ample time to the testing facility, and our office to run,  receive and review results. Please do not call inquiring of results, even if you can see them in your chart. We will contact you as soon as we are able. If it has been over 1 week since the test was completed, and you have not yet heard from us , then please call us .    - echart message- for normal results that have been seen by the patient already.   - telephone call: abnormal results or if patient has not viewed results in their echart.  If a referral to a specialist was entered for you, please call us  in 2 weeks if you have not heard from the specialist office to schedule.     benazepril  (LOTENSIN ) 40 MG tablet 06/30/2024          40 mg, DailyAuthorized By: [name] LABOR, DODispense: 90 tabletRefills: 1 ordered    []   cyclobenzaprine  (FLEXERIL ) 5 MG tablet 06/30/2024        5 mg, 3 times daily PRNAuthorized By: Myrikal Messmer A, DODispense: 90 tabletRefills: 1 ordered    []   02/22/2024        100 mg, 2 times dailyAuthorized By: Mavis Purchase, MDDispense: 30 capsuleRefills: 0 ordered    []   lovastatin  (MEVACOR ) 40 MG tablet 04/13/2024        40 mg, Daily at bedtimeAuthorized By: Astha Probasco A, DODispense: 90 tabletRefills: 3 ordered    []   mirtazapine  (REMERON ) 30 MG tablet 06/30/2024        30 mg, Daily at bedtimeAuthorized By: Nemesis Rainwater A, DODispense: 90 tabletRefills: 1 ordered    []   pantoprazole  (PROTONIX ) 40 MG tablet 06/30/2024        40 mg, DailyAuthorized By: Adianna Darwin A, DODispense: 90 tabletRefills: 1 ordered      pregabalin  (LYRICA )  150 MG capsule 04/13/2024        150 mg, 2 times dailyAuthorized By: [name] A, DODispense: 180 capsuleRefills: 1 ordered      XTAMPZA  ER 18 MG C12A

## 2024-07-05 DIAGNOSIS — M25511 Pain in right shoulder: Secondary | ICD-10-CM | POA: Diagnosis not present

## 2024-07-13 DIAGNOSIS — S42033A Displaced fracture of lateral end of unspecified clavicle, initial encounter for closed fracture: Secondary | ICD-10-CM | POA: Insufficient documentation

## 2024-07-13 DIAGNOSIS — S42031A Displaced fracture of lateral end of right clavicle, initial encounter for closed fracture: Secondary | ICD-10-CM | POA: Diagnosis not present

## 2024-07-18 ENCOUNTER — Other Ambulatory Visit: Payer: Self-pay

## 2024-07-18 ENCOUNTER — Encounter (HOSPITAL_BASED_OUTPATIENT_CLINIC_OR_DEPARTMENT_OTHER): Payer: Self-pay | Admitting: Orthopedic Surgery

## 2024-07-18 NOTE — Progress Notes (Signed)
   07/18/24 1033  PAT Phone Screen  Is the patient taking a GLP-1 receptor agonist? No  Do You Have Diabetes? No  Do You Have Hypertension? Yes  Have You Ever Been to the ER for Asthma? No  Have You Taken Oral Steroids in the Past 3 Months? No  Do you Take Phenteramine or any Other Diet Drugs? No  Recent  Lab Work, EKG, CXR? Yes  Where was this test performed? 06-30-24 CBC, CMP, 02-14-24 EKG  Do you have a history of heart problems? (S)  No (2021 ECHO EF 60-65%)  Any Recent Hospitalizations? No  Height 5' 10 (1.778 m)  Weight 75.9 kg  Pat Appointment Scheduled No  Reason for No Appointment Not Needed

## 2024-07-21 ENCOUNTER — Ambulatory Visit (HOSPITAL_BASED_OUTPATIENT_CLINIC_OR_DEPARTMENT_OTHER): Admitting: Anesthesiology

## 2024-07-21 ENCOUNTER — Ambulatory Visit (HOSPITAL_BASED_OUTPATIENT_CLINIC_OR_DEPARTMENT_OTHER)
Admission: RE | Admit: 2024-07-21 | Discharge: 2024-07-21 | Disposition: A | Discharge status: Home / Self Care | Attending: Orthopedic Surgery | Admitting: Orthopedic Surgery

## 2024-07-21 ENCOUNTER — Other Ambulatory Visit: Payer: Self-pay

## 2024-07-21 ENCOUNTER — Encounter (HOSPITAL_BASED_OUTPATIENT_CLINIC_OR_DEPARTMENT_OTHER): Admission: RE | Disposition: A | Payer: Self-pay | Source: Home / Self Care | Attending: Orthopedic Surgery

## 2024-07-21 ENCOUNTER — Encounter (HOSPITAL_BASED_OUTPATIENT_CLINIC_OR_DEPARTMENT_OTHER): Payer: Self-pay | Admitting: Orthopedic Surgery

## 2024-07-21 ENCOUNTER — Ambulatory Visit (HOSPITAL_COMMUNITY)

## 2024-07-21 ENCOUNTER — Telehealth: Payer: Self-pay

## 2024-07-21 DIAGNOSIS — S42021A Displaced fracture of shaft of right clavicle, initial encounter for closed fracture: Secondary | ICD-10-CM

## 2024-07-21 DIAGNOSIS — F1120 Opioid dependence, uncomplicated: Secondary | ICD-10-CM | POA: Diagnosis not present

## 2024-07-21 DIAGNOSIS — I10 Essential (primary) hypertension: Secondary | ICD-10-CM | POA: Diagnosis not present

## 2024-07-21 DIAGNOSIS — M1990 Unspecified osteoarthritis, unspecified site: Secondary | ICD-10-CM | POA: Diagnosis not present

## 2024-07-21 DIAGNOSIS — G8929 Other chronic pain: Secondary | ICD-10-CM | POA: Diagnosis not present

## 2024-07-21 DIAGNOSIS — Z01818 Encounter for other preprocedural examination: Secondary | ICD-10-CM

## 2024-07-21 DIAGNOSIS — G8918 Other acute postprocedural pain: Secondary | ICD-10-CM | POA: Diagnosis not present

## 2024-07-21 DIAGNOSIS — S42031A Displaced fracture of lateral end of right clavicle, initial encounter for closed fracture: Secondary | ICD-10-CM | POA: Insufficient documentation

## 2024-07-21 DIAGNOSIS — Z79899 Other long term (current) drug therapy: Secondary | ICD-10-CM | POA: Diagnosis not present

## 2024-07-21 DIAGNOSIS — I2510 Atherosclerotic heart disease of native coronary artery without angina pectoris: Secondary | ICD-10-CM | POA: Insufficient documentation

## 2024-07-21 DIAGNOSIS — Z9889 Other specified postprocedural states: Secondary | ICD-10-CM | POA: Diagnosis not present

## 2024-07-21 DIAGNOSIS — X58XXXA Exposure to other specified factors, initial encounter: Secondary | ICD-10-CM | POA: Insufficient documentation

## 2024-07-21 DIAGNOSIS — S42001A Fracture of unspecified part of right clavicle, initial encounter for closed fracture: Secondary | ICD-10-CM | POA: Diagnosis not present

## 2024-07-21 DIAGNOSIS — K219 Gastro-esophageal reflux disease without esophagitis: Secondary | ICD-10-CM | POA: Diagnosis not present

## 2024-07-21 HISTORY — DX: Gastro-esophageal reflux disease without esophagitis: K21.9

## 2024-07-21 HISTORY — PX: ORIF CLAVICULAR FRACTURE: SHX5055

## 2024-07-21 SURGERY — OPEN REDUCTION INTERNAL FIXATION (ORIF) CLAVICULAR FRACTURE
Anesthesia: General | Site: Shoulder | Laterality: Right

## 2024-07-21 MED ORDER — CEFAZOLIN SODIUM-DEXTROSE 2-4 GM/100ML-% IV SOLN
INTRAVENOUS | Status: AC
  Filled 2024-07-21: qty 100

## 2024-07-21 MED ORDER — VANCOMYCIN HCL 1000 MG IV SOLR
INTRAVENOUS | Status: AC
  Filled 2024-07-21: qty 20

## 2024-07-21 MED ORDER — PROPOFOL 10 MG/ML IV BOLUS
INTRAVENOUS | Status: DC | PRN
  Administered 2024-07-21: 140 mg via INTRAVENOUS

## 2024-07-21 MED ORDER — ONDANSETRON 4 MG PO TBDP: 4.0000 mg | ORAL_TABLET | Freq: Three times a day (TID) | ORAL | 0 refills | Status: AC | PRN

## 2024-07-21 MED ORDER — OXYCODONE HCL 5 MG PO TABS
ORAL_TABLET | ORAL | Status: AC
  Filled 2024-07-21: qty 1

## 2024-07-21 MED ORDER — ACETAMINOPHEN 500 MG PO TABS
ORAL_TABLET | ORAL | Status: AC
  Filled 2024-07-21: qty 2

## 2024-07-21 MED ORDER — FENTANYL CITRATE (PF) 100 MCG/2ML IJ SOLN
INTRAMUSCULAR | Status: AC
  Filled 2024-07-21: qty 2

## 2024-07-21 MED ORDER — ONDANSETRON HCL 4 MG/2ML IJ SOLN
INTRAMUSCULAR | Status: DC | PRN
Start: 2024-07-21 — End: 2024-07-21
  Administered 2024-07-21: 4 mg via INTRAVENOUS

## 2024-07-21 MED ORDER — LACTATED RINGERS IV SOLN: INTRAVENOUS | Status: DC

## 2024-07-21 MED ORDER — PROPOFOL 10 MG/ML IV BOLUS
INTRAVENOUS | Status: AC
  Filled 2024-07-21: qty 20

## 2024-07-21 MED ORDER — EPHEDRINE SULFATE (PRESSORS) 50 MG/ML IJ SOLN
INTRAMUSCULAR | Status: DC | PRN
  Administered 2024-07-21: 5 mg via INTRAVENOUS

## 2024-07-21 MED ORDER — TRANEXAMIC ACID-NACL 1000-0.7 MG/100ML-% IV SOLN
1000.0000 mg | INTRAVENOUS | Status: AC
  Administered 2024-07-21: 1000 mg via INTRAVENOUS

## 2024-07-21 MED ORDER — PHENYLEPHRINE 80 MCG/ML (10ML) SYRINGE FOR IV PUSH (FOR BLOOD PRESSURE SUPPORT)
PREFILLED_SYRINGE | INTRAVENOUS | Status: AC
  Filled 2024-07-21: qty 10

## 2024-07-21 MED ORDER — DEXAMETHASONE SODIUM PHOSPHATE 4 MG/ML IJ SOLN
INTRAMUSCULAR | Status: DC | PRN
  Administered 2024-07-21: 10 mg via INTRAVENOUS

## 2024-07-21 MED ORDER — LIDOCAINE HCL (CARDIAC) PF 100 MG/5ML IV SOSY
PREFILLED_SYRINGE | INTRAVENOUS | Status: DC | PRN
  Administered 2024-07-21: 40 mg via INTRAVENOUS

## 2024-07-21 MED ORDER — DEXAMETHASONE SODIUM PHOSPHATE 10 MG/ML IJ SOLN
INTRAMUSCULAR | Status: AC
  Filled 2024-07-21: qty 1

## 2024-07-21 MED ORDER — FENTANYL CITRATE (PF) 100 MCG/2ML IJ SOLN
50.0000 ug | Freq: Once | INTRAMUSCULAR | Status: AC
  Administered 2024-07-21: 50 ug via INTRAVENOUS

## 2024-07-21 MED ORDER — VANCOMYCIN HCL 500 MG IV SOLR
INTRAVENOUS | Status: AC
  Filled 2024-07-21: qty 10

## 2024-07-21 MED ORDER — MIDAZOLAM HCL 2 MG/2ML IJ SOLN
INTRAMUSCULAR | Status: AC
  Filled 2024-07-21: qty 2

## 2024-07-21 MED ORDER — CEFAZOLIN SODIUM-DEXTROSE 2-4 GM/100ML-% IV SOLN
2.0000 g | INTRAVENOUS | Status: AC
  Administered 2024-07-21: 2 g via INTRAVENOUS

## 2024-07-21 MED ORDER — ROCURONIUM BROMIDE 10 MG/ML (PF) SYRINGE
PREFILLED_SYRINGE | INTRAVENOUS | Status: AC
  Filled 2024-07-21: qty 10

## 2024-07-21 MED ORDER — ROCURONIUM BROMIDE 100 MG/10ML IV SOLN
INTRAVENOUS | Status: DC | PRN
  Administered 2024-07-21: 50 mg via INTRAVENOUS

## 2024-07-21 MED ORDER — OXYCODONE HCL 5 MG PO TABS
5.0000 mg | ORAL_TABLET | Freq: Once | ORAL | Status: AC | PRN
  Administered 2024-07-21: 5 mg via ORAL

## 2024-07-21 MED ORDER — ONDANSETRON HCL 4 MG/2ML IJ SOLN: 4.0000 mg | Freq: Once | INTRAMUSCULAR | Status: DC | PRN

## 2024-07-21 MED ORDER — 0.9 % SODIUM CHLORIDE (POUR BTL) OPTIME
TOPICAL | Status: DC | PRN
  Administered 2024-07-21: 1000 mL

## 2024-07-21 MED ORDER — SUGAMMADEX SODIUM 200 MG/2ML IV SOLN
INTRAVENOUS | Status: DC | PRN
Start: 2024-07-21 — End: 2024-07-21
  Administered 2024-07-21: 200 mg via INTRAVENOUS

## 2024-07-21 MED ORDER — LIDOCAINE 2% (20 MG/ML) 5 ML SYRINGE
INTRAMUSCULAR | Status: AC
  Filled 2024-07-21: qty 5

## 2024-07-21 MED ORDER — ONDANSETRON HCL 4 MG/2ML IJ SOLN
INTRAMUSCULAR | Status: AC
  Filled 2024-07-21: qty 2

## 2024-07-21 MED ORDER — EPHEDRINE 5 MG/ML INJ
INTRAVENOUS | Status: AC
  Filled 2024-07-21: qty 5

## 2024-07-21 MED ORDER — PHENYLEPHRINE HCL (PRESSORS) 10 MG/ML IV SOLN
INTRAVENOUS | Status: DC | PRN
Start: 2024-07-21 — End: 2024-07-21
  Administered 2024-07-21 (×6): 80 ug via INTRAVENOUS

## 2024-07-21 MED ORDER — BUPIVACAINE LIPOSOME 1.3 % IJ SUSP
INTRAMUSCULAR | Status: DC | PRN
  Administered 2024-07-21: 10 mL via PERINEURAL

## 2024-07-21 MED ORDER — TRANEXAMIC ACID-NACL 1000-0.7 MG/100ML-% IV SOLN
INTRAVENOUS | Status: AC
  Filled 2024-07-21: qty 100

## 2024-07-21 MED ORDER — ACETAMINOPHEN 500 MG PO TABS
1000.0000 mg | ORAL_TABLET | Freq: Once | ORAL | Status: AC
  Administered 2024-07-21: 1000 mg via ORAL

## 2024-07-21 MED ORDER — HYDROMORPHONE HCL 2 MG PO TABS: 2.0000 mg | ORAL_TABLET | ORAL | 0 refills | Status: AC | PRN

## 2024-07-21 MED ORDER — FENTANYL CITRATE (PF) 100 MCG/2ML IJ SOLN
INTRAMUSCULAR | Status: DC | PRN
  Administered 2024-07-21 (×2): 50 ug via INTRAVENOUS

## 2024-07-21 MED ORDER — OXYCODONE HCL 5 MG/5ML PO SOLN: 5.0000 mg | Freq: Once | ORAL | Status: AC | PRN

## 2024-07-21 MED ORDER — BUPIVACAINE HCL (PF) 0.5 % IJ SOLN
INTRAMUSCULAR | Status: DC | PRN
  Administered 2024-07-21: 15 mL via PERINEURAL

## 2024-07-21 MED ORDER — FENTANYL CITRATE (PF) 100 MCG/2ML IJ SOLN
25.0000 ug | INTRAMUSCULAR | Status: DC | PRN
  Administered 2024-07-21 (×2): 50 ug via INTRAVENOUS

## 2024-07-21 MED ORDER — VANCOMYCIN HCL 500 MG IV SOLR
INTRAVENOUS | Status: DC | PRN
  Administered 2024-07-21: 500 mg via TOPICAL

## 2024-07-21 SURGICAL SUPPLY — 47 items
BIT DRILL 2 CANN GRADUATED (BIT) IMPLANT
BIT DRILL 2.5 CANN ENDOSCOPIC (BIT) IMPLANT
BLADE SURG 10 STRL SS (BLADE) ×1 IMPLANT
BLADE SURG 15 STRL LF DISP TIS (BLADE) ×1 IMPLANT
BUTTON DIST CLAVICLE PLATE TR (Anchor) IMPLANT
CLSR STERI-STRIP ANTIMIC 1/2X4 (GAUZE/BANDAGES/DRESSINGS) ×1 IMPLANT
COOLER ICEMAN CLASSIC (MISCELLANEOUS) ×1 IMPLANT
COVER MAYO STAND STRL (DRAPES) IMPLANT
DRAPE C-ARM 42X72 X-RAY (DRAPES) ×1 IMPLANT
DRAPE IMP U-DRAPE 54X76 (DRAPES) ×1 IMPLANT
DRAPE INCISE IOBAN 66X45 STRL (DRAPES) ×1 IMPLANT
DRAPE U-SHAPE 47X51 STRL (DRAPES) ×2 IMPLANT
DRAPE U-SHAPE 76X120 STRL (DRAPES) ×2 IMPLANT
DRSG AQUACEL AG ADV 3.5X 6 (GAUZE/BANDAGES/DRESSINGS) IMPLANT
DRSG AQUACEL AG ADV 3.5X10 (GAUZE/BANDAGES/DRESSINGS) ×1 IMPLANT
DURAPREP 26ML APPLICATOR (WOUND CARE) ×1 IMPLANT
ELECTRODE REM PT RTRN 9FT ADLT (ELECTROSURGICAL) ×1 IMPLANT
GLOVE BIO SURGEON STRL SZ7.5 (GLOVE) ×2 IMPLANT
GLOVE BIOGEL PI IND STRL 8 (GLOVE) ×2 IMPLANT
GOWN STRL REUS W/ TWL LRG LVL3 (GOWN DISPOSABLE) ×1 IMPLANT
GOWN STRL REUS W/TWL XL LVL3 (GOWN DISPOSABLE) ×2 IMPLANT
KWIRE BB-TAK (WIRE) IMPLANT
PACK ARTHROSCOPY DSU (CUSTOM PROCEDURE TRAY) ×1 IMPLANT
PACK BASIN DAY SURGERY FS (CUSTOM PROCEDURE TRAY) ×1 IMPLANT
PAD COLD SHLDR WRAP-ON (PAD) ×1 IMPLANT
PENCIL SMOKE EVACUATOR (MISCELLANEOUS) ×1 IMPLANT
PIN DRILL 3.7MM KNTLS DIST (PIN) IMPLANT
PLATE DISTAL CLAVICLE SH RT SS (Plate) IMPLANT
RESTRAINT HEAD UNIVERSAL NS (MISCELLANEOUS) IMPLANT
SCREW COMP KREULOCK 2.7X20 (Screw) IMPLANT
SCREW LOW PROFILE 3.5X14 (Screw) IMPLANT
SCREW LOW PROFILE 3.5X16 (Screw) IMPLANT
SCREW NLOCK T15 FT 18X3.5XST (Screw) IMPLANT
SHEET MEDIUM DRAPE 40X70 STRL (DRAPES) IMPLANT
SLEEVE SCD COMPRESS KNEE MED (STOCKING) ×1 IMPLANT
SLING ARM FOAM STRAP LRG (SOFTGOODS) IMPLANT
SPIKE FLUID TRANSFER (MISCELLANEOUS) IMPLANT
SPONGE T-LAP 18X18 ~~LOC~~+RFID (SPONGE) ×1 IMPLANT
SUCTION TUBE FRAZIER 10FR DISP (SUCTIONS) ×1 IMPLANT
SUT MNCRL AB 3-0 PS2 27 (SUTURE) ×1 IMPLANT
SUT MON AB 2-0 CT1 36 (SUTURE) ×1 IMPLANT
SUT VIC AB 0 CT1 27XBRD ANBCTR (SUTURE) ×1 IMPLANT
SUT VIC AB 1 CT1 27XBRD ANBCTR (SUTURE) ×1 IMPLANT
SUT VIC AB 2-0 SH 27XBRD (SUTURE) ×1 IMPLANT
SYR BULB IRRIG 60ML STRL (SYRINGE) ×1 IMPLANT
TOWEL GREEN STERILE FF (TOWEL DISPOSABLE) ×1 IMPLANT
TUBE SUCTION HIGH CAP CLEAR NV (SUCTIONS) ×1 IMPLANT
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
## 2024-07-21 NOTE — Anesthesia Procedure Notes (Signed)
 Procedure Name: Intubation Date/Time: 07/21/2024 11:52 AM  Performed by: Claudene Delon SQUIBB, CRNAPre-anesthesia Checklist: Patient identified, Emergency Drugs available, Suction available and Patient being monitored Patient Re-evaluated:Patient Re-evaluated prior to induction Oxygen Delivery Method: Circle System Utilized Preoxygenation: Pre-oxygenation with 100% oxygen Induction Type: IV induction Ventilation: Mask ventilation without difficulty and Oral airway inserted - appropriate to patient size Laryngoscope Size: Glidescope and 3 Grade View: Grade I Tube type: Oral Tube size: 7.5 mm Number of attempts: 1 Airway Equipment and Method: Rigid stylet Placement Confirmation: ETT inserted through vocal cords under direct vision, positive ETCO2 and breath sounds checked- equal and bilateral Secured at: 22 cm Tube secured with: Tape Dental Injury: Teeth and Oropharynx as per pre-operative assessment

## 2024-07-21 NOTE — Telephone Encounter (Signed)
 Communication  Reason for CRM: Patient's spouse is calling to inform Brian Murray that patient had surgery to repair a broken collarbone caused by a fall. The surgeon is prescribing  dilaudid   for the pain and advised the patient's spouse to call and inform [facility].   FYI.

## 2024-07-21 NOTE — Transfer of Care (Signed)
 Immediate Anesthesia Transfer of Care Note  Patient: Brian Murray.  Procedure(s) Performed: OPEN REDUCTION INTERNAL FIXATION (ORIF) CLAVICULAR FRACTURE (Right: Shoulder)  Patient Location: PACU  Anesthesia Type:General  Level of Consciousness: awake and alert   Airway & Oxygen Therapy: Patient Spontanous Breathing and Patient connected to face mask oxygen  Post-op Assessment: Report given to RN  Post vital signs: Reviewed  Last Vitals:  Vitals Value Taken Time  BP 131/79 07/21/24 13:32  Temp 36.7 C 07/21/24 13:30  Pulse 92 07/21/24 13:35  Resp 12 07/21/24 13:35  SpO2 99 % 07/21/24 13:35  Vitals shown include unfiled device data.  Last Pain:  Vitals:   07/21/24 1024  TempSrc: Temporal  PainSc: 9       Patients Stated Pain Goal: 5 (07/21/24 1024)  Complications: No notable events documented.

## 2024-07-21 NOTE — Anesthesia Postprocedure Evaluation (Signed)
 Anesthesia Post Note  Patient: Brian Murray.  Procedure(s) Performed: OPEN REDUCTION INTERNAL FIXATION (ORIF) CLAVICULAR FRACTURE (Right: Shoulder)     Patient location during evaluation: PACU Anesthesia Type: General Level of consciousness: awake and alert Pain management: pain level controlled Vital Signs Assessment: post-procedure vital signs reviewed and stable Respiratory status: spontaneous breathing, nonlabored ventilation and respiratory function stable Cardiovascular status: stable and blood pressure returned to baseline Anesthetic complications: no   No notable events documented.  Last Vitals:  Vitals:   07/21/24 1345 07/21/24 1400  BP: (!) 140/77 133/79  Pulse: 92 93  Resp: 15 17  Temp:    SpO2: 100% 100%    Last Pain:  Vitals:   07/21/24 1400  TempSrc:   PainSc: 5                  Debby FORBES Like

## 2024-07-21 NOTE — Anesthesia Preprocedure Evaluation (Addendum)
 Anesthesia Evaluation  Patient identified by MRN, date of birth, ID band Patient awake    Reviewed: Allergy & Precautions, NPO status , Patient's Chart, lab work & pertinent test results  History of Anesthesia Complications Negative for: history of anesthetic complications  Airway Mallampati: III  TM Distance: >3 FB Neck ROM: Limited    Dental  (+) Dental Advisory Given, Teeth Intact   Pulmonary neg pulmonary ROS   Pulmonary exam normal        Cardiovascular hypertension, Pt. on medications + CAD  Normal cardiovascular exam     Neuro/Psych  Headaches  negative psych ROS   GI/Hepatic Neg liver ROS,GERD  Medicated and Controlled,,  Endo/Other  negative endocrine ROS    Renal/GU negative Renal ROS     Musculoskeletal  (+) Arthritis ,  narcotic dependent  Abdominal   Peds  Hematology negative hematology ROS (+)   Anesthesia Other Findings Chronic pain  Reproductive/Obstetrics                              Anesthesia Physical Anesthesia Plan  ASA: 2  Anesthesia Plan: General   Post-op Pain Management: Tylenol  PO (pre-op)* and Regional block*   Induction: Intravenous  PONV Risk Score and Plan: 2 and Treatment may vary due to age or medical condition, Ondansetron  and Dexamethasone   Airway Management Planned: Oral ETT and Video Laryngoscope Planned  Additional Equipment: None  Intra-op Plan:   Post-operative Plan: Extubation in OR  Informed Consent: I have reviewed the patients History and Physical, chart, labs and discussed the procedure including the risks, benefits and alternatives for the proposed anesthesia with the patient or authorized representative who has indicated his/her understanding and acceptance.     Dental advisory given  Plan Discussed with: CRNA and Anesthesiologist  Anesthesia Plan Comments:          Anesthesia Quick Evaluation

## 2024-07-21 NOTE — Progress Notes (Signed)
Assisted Dr. Brock with right, interscalene , ultrasound guided block. Side rails up, monitors on throughout procedure. See vital signs in flow sheet. Tolerated Procedure well. 

## 2024-07-21 NOTE — Op Note (Signed)
 Date of Surgery: 07/21/2024  INDICATIONS: Mr. Hogen is a 73 y.o.-year-old male with a right distal clavicle fracture with CC ligament disruption;  The patient did consent to the procedure after discussion of the risks and benefits.  PREOPERATIVE DIAGNOSIS:  1.  Right distal clavicle fracture clavicle fracture, closed. 2.  Right coracoclavicular ligament disruption   POSTOPERATIVE DIAGNOSIS: Same.   PROCEDURE: 1.  Open treatment of Right distal clavicle fracture with internal fixation 2.  Open treatment with acute repair of coracoclavicular ligaments of Right shoulder   SURGEON: Selinda SHAUNNA Gosling, M.D.   ASSIST: Dayle Moores, PA-C   Assistant attestation:   PA Mcclung scrubbed and present for the entire procedure..   ANESTHESIA:  general, Exparel  interscalene   IV FLUIDS AND URINE: See anesthesia.   ESTIMATED BLOOD LOSS: 20 mL.   IMPLANTS: Arthrex distal clavicle plate Arthrex tight rope x 1   COMPLICATIONS: None.   DESCRIPTION OF PROCEDURE: The patient was brought to the operating room and placed supine on the operating table.  The patient had been signed prior to the procedure and this was documented. The patient had the anesthesia placed by the anesthesiologist.  The patient was then placed in the beach chair position.  All bony prominences were well padded.  A time-out was performed to confirm that this was the correct patient, site, side and location. The patient had an SCD on the lower extremities. The patient did receive antibiotics prior to the incision and was re-dosed during the procedure as needed at indicated intervals.  The patient had the operative extremity prepped and draped in the standard surgical fashion.    A horizontal incision based over the clavicle was used.  Cutaneous nerves were identified and protected as much as possible.      We then identified this distal clavicle fracture.  There was a very short segment of distal clavicle still at the [facility] joint.  We  placed a spinal needle in the [facility] joint to help us  get the plate as lateral as we could.  Next, we provisionally fixed the plate with 2 Kirschner wires.  We checked the position on x-ray.  We were not able to clamp the reduction due to the very small distal clavicle fragment.  Thus we began with direct pressure on the medial clavicular segment to allow the reduction maneuver to occur.  We then fixed the plate to the shaft first with a 3.5 mm cortical screw.  Next we placed 4 locking screws into the distal cluster.  This allowed for good stability, but still needed to address the coracoclavicular ligament injury.  Next he moved ahead with the coracoclavicular ligament repair utilizing an Arthrex tight rope device.  We drilled through 4 cortices, 2 on the clavicle and 2 in the coracoid.  We did this carefully and under direct fluoroscopic visualization.  We then placed the tight rope device across all 4 cortices.  We then tensioned this in a reduced position.  We then completed the procedure by placing 2 more cortical screws into the shaft medially.  These had excellent fixation as well.     The wound was thoroughly irrigated and closed in a layer fashion using 0 vicryl, 2.0 vicryl and subcuticular 3-0 Monocryl.  Sterile dressings were applied and the patient was extubated and transferred to the PACU in stable condition.   POSTOPERATIVE PLAN: Patient will be in a sling for comfort.  He is allowed to range his shoulder up to the level of the shoulder  and not allowed to lift anything.  Discharge home today from PACU.  Follow-up with us  in 2 weeks with 2 x-ray views of the left clavicle.

## 2024-07-21 NOTE — Discharge Instructions (Addendum)
 Orthopedic surgery discharge instructions:  -Maintain postoperative bandage until your follow-up appointment.  This bandage is waterproof and you may shower and that bandage beginning on postoperative day #2.  Please do not submerge the shoulder underwater.  -Maintain your sling at all times unless performing activities of daily living or school/desk work.  No lifting above shoulder height for the first 2 weeks.  You may move the elbow, hand and wrist as tolerated.  Again and you are free to use the right arm for activities of daily living such as eating, getting dressed, and bathing and doing schoolwork.  -Apply ice to the operative shoulder along the surgical bandage for 20 to 30 minutes out of each hour.  Do this as frequently as possible throughout the day.  -For mild to moderate pain you should use Tylenol  and or Advil in alternating fashion.  Do this throughout the day and utilize hydrocodone  as needed for breakthrough pain. you should try to wean off of the hydrocodone  over the next 3-5 days.  -You will return to see Dr. Sharl in the office in 2 weeks for postoperative x-rays and wound check.   No Tylenol  before 4:30pm    Post Anesthesia Home Care Instructions  Activity: Get plenty of rest for the remainder of the day. A responsible individual must stay with you for 24 hours following the procedure.  For the next 24 hours, DO NOT: -Drive a car -Advertising copywriter -Drink alcoholic beverages -Take any medication unless instructed by your physician -Make any legal decisions or sign important papers.  Meals: Start with liquid foods such as gelatin or soup. Progress to regular foods as tolerated. Avoid greasy, spicy, heavy foods. If nausea and/or vomiting occur, drink only clear liquids until the nausea and/or vomiting subsides. Call your physician if vomiting continues.  Special Instructions/Symptoms: Your throat may feel dry or sore from the anesthesia or the breathing tube placed  in your throat during surgery. If this causes discomfort, gargle with warm salt water. The discomfort should disappear within 24 hours.  If you had a scopolamine patch placed behind your ear for the management of post- operative nausea and/or vomiting:  1. The medication in the patch is effective for 72 hours, after which it should be removed.  Wrap patch in a tissue and discard in the trash. Wash hands thoroughly with soap and water. 2. You may remove the patch earlier than 72 hours if you experience unpleasant side effects which may include dry mouth, dizziness or visual disturbances. 3. Avoid touching the patch. Wash your hands with soap and water after contact with the patch.   Regional Anesthesia Blocks  1. You may not be able to move or feel the blocked extremity after a regional anesthetic block. This may last may last from 3-48 hours after placement, but it will go away. The length of time depends on the medication injected and your individual response to the medication. As the nerves start to wake up, you may experience tingling as the movement and feeling returns to your extremity. If the numbness and inability to move your extremity has not gone away after 48 hours, please call your surgeon.   2. The extremity that is blocked will need to be protected until the numbness is gone and the strength has returned. Because you cannot feel it, you will need to take extra care to avoid injury. Because it may be weak, you may have difficulty moving it or using it. You may not know what position  it is in without looking at it while the block is in effect.  3. For blocks in the legs and feet, returning to weight bearing and walking needs to be done carefully. You will need to wait until the numbness is entirely gone and the strength has returned. You should be able to move your leg and foot normally before you try and bear weight or walk. You will need someone to be with you when you first try to  ensure you do not fall and possibly risk injury.  4. Bruising and tenderness at the needle site are common side effects and will resolve in a few days.  5. Persistent numbness or new problems with movement should be communicated to the surgeon or the [facility] [phone] [facility] [phone]).Information for Discharge Teaching: EXPAREL  (bupivacaine  liposome injectable suspension)   Pain relief is important to your recovery. The goal is to control your pain so you can move easier and return to your normal activities as soon as possible after your procedure. Your physician may use several types of medicines to manage pain, swelling, and more.  Your surgeon or anesthesiologist gave you EXPAREL (bupivacaine ) to help control your pain after surgery.  EXPAREL  is a local anesthetic designed to release slowly over an extended period of time to provide pain relief by numbing the tissue around the surgical site. EXPAREL  is designed to release pain medication over time and can control pain for up to 72 hours. Depending on how you respond to EXPAREL , you may require less pain medication during your recovery. EXPAREL  can help reduce or eliminate the need for opioids during the first few days after surgery when pain relief is needed the most. EXPAREL  is not an opioid and is not addictive. It does not cause sleepiness or sedation.   Important! A teal colored band has been placed on your arm with the date, time and amount of EXPAREL  you have received. Please leave this armband in place for the full 96 hours following administration, and then you may remove the band. If you return to the hospital for any reason within 96 hours following the administration of EXPAREL , the armband provides important information that your health care providers to know, and alerts them that you have received this anesthetic.    Possible side effects of EXPAREL : Temporary loss of sensation or  ability to move in the area where medication was injected. Nausea, vomiting, constipation Rarely, numbness and tingling in your mouth or lips, lightheadedness, or anxiety may occur. Call your doctor right away if you think you may be experiencing any of these sensations, or if you have other questions regarding possible side effects.  Follow all other discharge instructions given to you by your surgeon or nurse. Eat a healthy diet and drink plenty of water or other fluids.

## 2024-07-21 NOTE — Brief Op Note (Signed)
 07/21/2024  1:07 PM  PATIENT:  Debby LITTIE Claudene Mickey.  73 y.o. male  PRE-OPERATIVE DIAGNOSIS:  Right displaced distal clavicle fracture, coracoclavicular ligament disruption  POST-OPERATIVE DIAGNOSIS:   Same  PROCEDURE:  Procedure(s): OPEN REDUCTION INTERNAL FIXATION (ORIF) CLAVICULAR FRACTURE (Right)  SURGEON:  Surgeons and Role:    * [name], MD - Primary  PHYSICIAN ASSISTANT: Dayle Moores, PA-C  ANESTHESIA:   regional and general  EBL:  100 mL   BLOOD ADMINISTERED:none  DRAINS: none   LOCAL MEDICATIONS USED:  NONE  SPECIMEN:  No Specimen  DISPOSITION OF SPECIMEN: n/a  COUNTS:  YES  TOURNIQUET:  * No tourniquets in log *  DICTATION: .Note written in EPIC  PLAN OF CARE: Discharge to home after PACU  PATIENT DISPOSITION:  PACU - hemodynamically stable.   Delay start of Pharmacological VTE agent (>24hrs) due to surgical blood loss or risk of bleeding: not applicable

## 2024-07-21 NOTE — Anesthesia Procedure Notes (Signed)
 Anesthesia Regional Block: Interscalene brachial plexus block   Pre-Anesthetic Checklist: , timeout performed,  Correct Patient, Correct Site, Correct Laterality,  Correct Procedure, Correct Position, site marked,  Risks and benefits discussed,  Surgical consent,  Pre-op evaluation,  At surgeon's request and post-op pain management  Laterality: Right  Prep: chloraprep       Needles:  Injection technique: Single-shot  Needle Type: Echogenic Needle     Needle Length: 5cm  Needle Gauge: 21     Additional Needles:   Narrative:  Start time: 07/21/2024 10:55 AM End time: 07/21/2024 10:59 AM Injection made incrementally with aspirations every 5 mL.  Performed by: Personally  Anesthesiologist: Lucious Debby BRAVO, MD  Additional Notes: No pain on injection. No increased resistance to injection. Injection made in 5cc increments. Good needle visualization. Patient tolerated the procedure well.

## 2024-07-21 NOTE — Telephone Encounter (Signed)
 noted

## 2024-07-24 ENCOUNTER — Encounter (HOSPITAL_BASED_OUTPATIENT_CLINIC_OR_DEPARTMENT_OTHER): Payer: Self-pay | Admitting: Orthopedic Surgery

## 2024-07-25 ENCOUNTER — Encounter: Payer: Self-pay | Admitting: Pharmacist

## 2024-07-25 NOTE — Progress Notes (Signed)
 Pharmacy Quality Measure Review  This patient is appearing on a report for being at risk of failing the adherence measure for cholesterol (statin) and hypertension (ACEi/ARB) medications this calendar year.   Medication: benazepril  40mg  Last fill date: 04/13/2024 for 90 day supply  Medication: lovastatin   Last fill date: 02/07/2024 for 90 day supply  Reviewed recent refill history. Patient filled benazepril  for 90 days supply on 07/02/2024. He has 1 refill remaining. Filled lovastatin  for 90 day supply on 09/08/202. He has 2 refills remaining. His next follow up with Dr Earlean is 09/18/2024.   Insurance report was not up to date. No action needed at this time.  Will continue to follow for December fills.   Madelin Ray, PharmD Clinical Pharmacist [facility]  Population Health [phone]

## 2024-07-28 ENCOUNTER — Other Ambulatory Visit: Payer: Self-pay | Admitting: Family Medicine

## 2024-07-28 NOTE — Telephone Encounter (Signed)
 Spoke with pharmacy to confirm refills were previously sent to pharmacy. Pharmacy will refill meds and contact pt today when ready.

## 2024-07-28 NOTE — Telephone Encounter (Unsigned)
 Copied from CRM [id number]. Topic: Clinical - Medication Refill >> Jul 28, 2024  8:44 AM Vena HERO wrote: Medication: XTAMPZA  ER 18 MG C12A  Has the patient contacted their pharmacy? Yes (Agent: If no, request that the patient contact the pharmacy for the refill. If patient does not wish to contact the pharmacy document the reason why and proceed with request.) (Agent: If yes, when and what did the pharmacy advise?)  This is the patient's preferred pharmacy:  [id number] - [place], [place] - [address] AT [facility] OF [address] [place] [address] Phone: [phone] Fax: [phone]  Is this the correct pharmacy for this prescription? Yes If no, delete pharmacy and type the correct one.   Has the prescription been filled recently? No  Is the patient out of the medication? Yes  Has the patient been seen for an appointment in the last year OR does the patient have an upcoming appointment? Yes  Can we respond through MyChart? Yes  Agent: Please be advised that Rx refills may take up to 3 business days. We ask that you follow-up with your pharmacy.

## 2024-08-03 DIAGNOSIS — Z4789 Encounter for other orthopedic aftercare: Secondary | ICD-10-CM | POA: Diagnosis not present

## 2024-08-24 DIAGNOSIS — R29898 Other symptoms and signs involving the musculoskeletal system: Secondary | ICD-10-CM | POA: Insufficient documentation

## 2024-08-28 ENCOUNTER — Other Ambulatory Visit: Payer: Self-pay | Admitting: Family Medicine

## 2024-08-28 NOTE — Telephone Encounter (Unsigned)
 Copied from CRM [id number]. Topic: Clinical - Medication Refill >> Aug 28, 2024  8:50 AM Deaijah H wrote: Medication: XTAMPZA  ER 18 MG C12A  Has the patient contacted their pharmacy? No (Agent: If no, request that the patient contact the pharmacy for the refill. If patient does not wish to contact the pharmacy document the reason why and proceed with request.) Can't. Controlled substance (Agent: If yes, when and what did the pharmacy advise?)  This is the patient's preferred pharmacy:  [facility] #[id number] - [place], [place] - [address] AT [facility] OF [address] [place] [address] Phone: [phone] Fax: [phone]  Is this the correct pharmacy for this prescription? Yes If no, delete pharmacy and type the correct one.   Has the prescription been filled recently? Yes  Is the patient out of the medication? Yes, runs out today   Has the patient been seen for an appointment in the last year OR does the patient have an upcoming appointment? Yes  Can we respond through MyChart? Yes  Agent: Please be advised that Rx refills may take up to 3 business days. We ask that you follow-up with your pharmacy.

## 2024-08-29 NOTE — Telephone Encounter (Signed)
 Requesting: Xtampza  Contract: 08/30/23 UDS: 08/30/23 Last Visit: 06/30/24 Next Visit: 09/18/24 Last Refill: 08/11/24 (60,0)  Please Advise. Rx pending

## 2024-09-18 ENCOUNTER — Encounter: Payer: Self-pay | Admitting: Family Medicine

## 2024-09-18 ENCOUNTER — Ambulatory Visit: Admitting: Family Medicine

## 2024-09-18 VITALS — BP 129/78 | HR 80 | Temp 97.9°F | Wt 167.0 lb

## 2024-09-18 DIAGNOSIS — G8928 Other chronic postprocedural pain: Secondary | ICD-10-CM

## 2024-09-18 DIAGNOSIS — M542 Cervicalgia: Secondary | ICD-10-CM

## 2024-09-18 DIAGNOSIS — R931 Abnormal findings on diagnostic imaging of heart and coronary circulation: Secondary | ICD-10-CM

## 2024-09-18 DIAGNOSIS — Z79899 Other long term (current) drug therapy: Secondary | ICD-10-CM

## 2024-09-18 DIAGNOSIS — I2510 Atherosclerotic heart disease of native coronary artery without angina pectoris: Secondary | ICD-10-CM

## 2024-09-18 DIAGNOSIS — E782 Mixed hyperlipidemia: Secondary | ICD-10-CM

## 2024-09-18 DIAGNOSIS — G8929 Other chronic pain: Secondary | ICD-10-CM

## 2024-09-18 DIAGNOSIS — S12500K Unspecified displaced fracture of sixth cervical vertebra, subsequent encounter for fracture with nonunion: Secondary | ICD-10-CM

## 2024-09-18 DIAGNOSIS — I77810 Thoracic aortic ectasia: Secondary | ICD-10-CM

## 2024-09-18 DIAGNOSIS — F1190 Opioid use, unspecified, uncomplicated: Secondary | ICD-10-CM

## 2024-09-18 DIAGNOSIS — F5101 Primary insomnia: Secondary | ICD-10-CM

## 2024-09-18 DIAGNOSIS — M5010 Cervical disc disorder with radiculopathy, unspecified cervical region: Secondary | ICD-10-CM

## 2024-09-18 DIAGNOSIS — I10 Essential (primary) hypertension: Secondary | ICD-10-CM

## 2024-09-18 MED ORDER — CYCLOBENZAPRINE HCL 5 MG PO TABS: 5.0000 mg | ORAL_TABLET | Freq: Three times a day (TID) | ORAL | 1 refills | Status: AC | PRN

## 2024-09-18 MED ORDER — PREGABALIN 150 MG PO CAPS: 150.0000 mg | ORAL_CAPSULE | Freq: Two times a day (BID) | ORAL | 1 refills | Status: AC

## 2024-09-18 MED ORDER — PANTOPRAZOLE SODIUM 40 MG PO TBEC: 40.0000 mg | DELAYED_RELEASE_TABLET | Freq: Every day | ORAL | 1 refills | Status: AC

## 2024-09-18 MED ORDER — XTAMPZA ER 18 MG PO C12A: 18.0000 mg | EXTENDED_RELEASE_CAPSULE | Freq: Two times a day (BID) | ORAL | 0 refills | Status: AC

## 2024-09-18 MED ORDER — BENAZEPRIL HCL 40 MG PO TABS: 40.0000 mg | ORAL_TABLET | Freq: Every day | ORAL | 1 refills | Status: AC

## 2024-09-18 MED ORDER — XTAMPZA ER 18 MG PO C12A: 18.0000 mg | EXTENDED_RELEASE_CAPSULE | Freq: Two times a day (BID) | ORAL | 0 refills | Status: DC

## 2024-09-18 MED ORDER — MIRTAZAPINE 30 MG PO TABS: 30.0000 mg | ORAL_TABLET | Freq: Every day | ORAL | 1 refills | Status: AC

## 2024-09-18 NOTE — Patient Instructions (Addendum)
 Return in about 11 weeks (around 12/04/2024) for Routine chronic condition follow-up.        Great to see you today.  I have refilled the medication(s) we provide.   If labs were collected or images ordered, we will inform you of  results once we have received them and reviewed. We will contact you either by echart message, or telephone call.  Please give ample time to the testing facility, and our office to run,  receive and review results. Please do not call inquiring of results, even if you can see them in your chart. We will contact you as soon as we are able. If it has been over 1 week since the test was completed, and you have not yet heard from us , then please call us .    - echart message- for normal results that have been seen by the patient already.   - telephone call: abnormal results or if patient has not viewed results in their echart.  If a referral to a specialist was entered for you, please call us  in 2 weeks if you have not heard from the specialist office to schedule.
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
## 2024-09-21 ENCOUNTER — Telehealth: Payer: Self-pay | Admitting: Pharmacist

## 2024-09-21 NOTE — Progress Notes (Signed)
 Pharmacy Quality Measure Review  This patient is appearing on a report for being at risk of failing the adherence measure for cholesterol (statin) medications this calendar year.   Medication: lovastatin   Last fill date: 06/26/2024 for 90 day supply  Reviewed recent refill history in Dr Annemarie database. Patient has not filled lovastatin  yet but will be due soon. Patient has 2 refills remaining. Next appointment with PCP is 12/05/2024.    Left voicemail for patient to return my call at their convenience. Reminded patient that refill was due soon and that he has refills remaining at his pharmacy.   Madelin Ray, PharmD Clinical Pharmacist [facility]  Population Health [phone]

## 2024-10-06 ENCOUNTER — Telehealth: Payer: Self-pay | Admitting: Family Medicine

## 2024-10-06 NOTE — Telephone Encounter (Unsigned)
 Copied from CRM #[id number]. Topic: Clinical - Prescription Issue >> Oct 06, 2024  2:58 PM Robinson H wrote: Reason for CRM: Patients wife Nena states pharmacy only filled 26 of the 60 pills written on the prescription for the XTAMPZA  ER 18 MG C12A, which is what the had in stock now pharmacy states they need a prescription to give the patient the rest of the 60.  Nena [phone]

## 2024-10-09 ENCOUNTER — Telehealth: Payer: Self-pay | Admitting: Family Medicine

## 2024-10-09 MED ORDER — XTAMPZA ER 18 MG PO C12A: 18.0000 mg | EXTENDED_RELEASE_CAPSULE | Freq: Two times a day (BID) | ORAL | 0 refills | Status: AC

## 2024-10-09 MED ORDER — XTAMPZA ER 18 MG PO C12A: 18.0000 mg | EXTENDED_RELEASE_CAPSULE | Freq: Two times a day (BID) | ORAL | 0 refills | Status: DC

## 2024-10-09 NOTE — Telephone Encounter (Signed)
 SABRA

## 2024-10-09 NOTE — Telephone Encounter (Signed)
 PDMP site down. Attempted to review. Will fill 34 tabs in new note- can use fx in this format

## 2024-12-05 ENCOUNTER — Ambulatory Visit: Admitting: Family Medicine
